# Patient Record
Sex: Female | Born: 1975 | Hispanic: Yes | Marital: Single | Smoking: Never smoker
Health system: Southern US, Community
[De-identification: ages and names within clinical notes are randomized; demographics above are authoritative.]

## PROBLEM LIST (undated history)

## (undated) DIAGNOSIS — I10 Essential (primary) hypertension: Secondary | ICD-10-CM

## (undated) DIAGNOSIS — K219 Gastro-esophageal reflux disease without esophagitis: Secondary | ICD-10-CM

## (undated) HISTORY — PX: CHOLECYSTECTOMY: SHX55

---

## 2004-05-16 ENCOUNTER — Other Ambulatory Visit: Payer: Self-pay

## 2009-07-24 ENCOUNTER — Emergency Department: Payer: Self-pay | Admitting: Emergency Medicine

## 2010-02-22 ENCOUNTER — Inpatient Hospital Stay: Payer: Self-pay | Admitting: Obstetrics and Gynecology

## 2010-11-19 ENCOUNTER — Ambulatory Visit: Payer: Self-pay | Admitting: Family Medicine

## 2010-12-13 ENCOUNTER — Encounter: Payer: Self-pay | Admitting: Obstetrics and Gynecology

## 2010-12-28 ENCOUNTER — Inpatient Hospital Stay: Payer: Self-pay | Admitting: Obstetrics and Gynecology

## 2011-01-03 ENCOUNTER — Encounter: Payer: Self-pay | Admitting: Maternal and Fetal Medicine

## 2011-11-02 IMAGING — US US OB US >=[ID] SNGL FETUS
1 series · 13 of 28 positions shown · non-contrast
Comparison: none

REASON FOR EXAM: dating approximately 7 months along
COMMENTS:

[Series 1: us ob us >=(id) sngl fetus · 0.21mm/px · 13 of 61 slices shown]
[im 3/61]
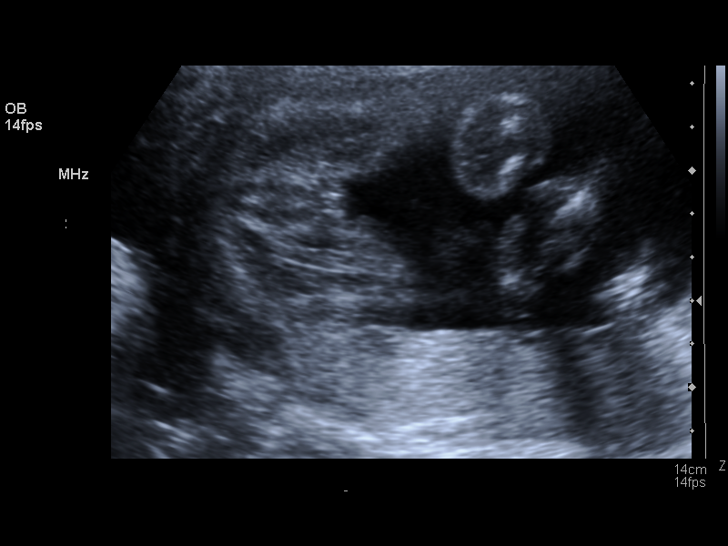
[im 7/61]
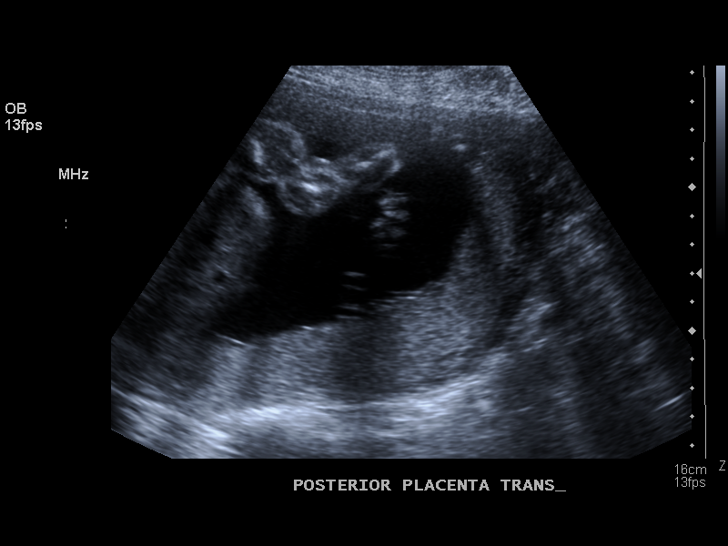
[im 12/61]
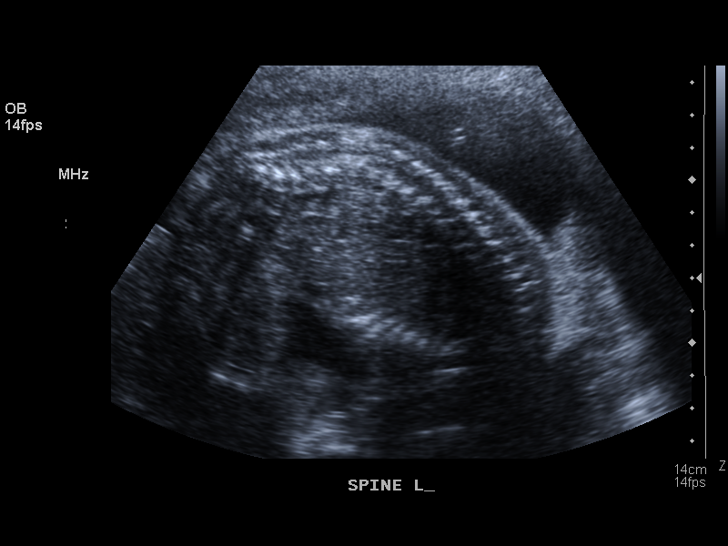
[im 16/61]
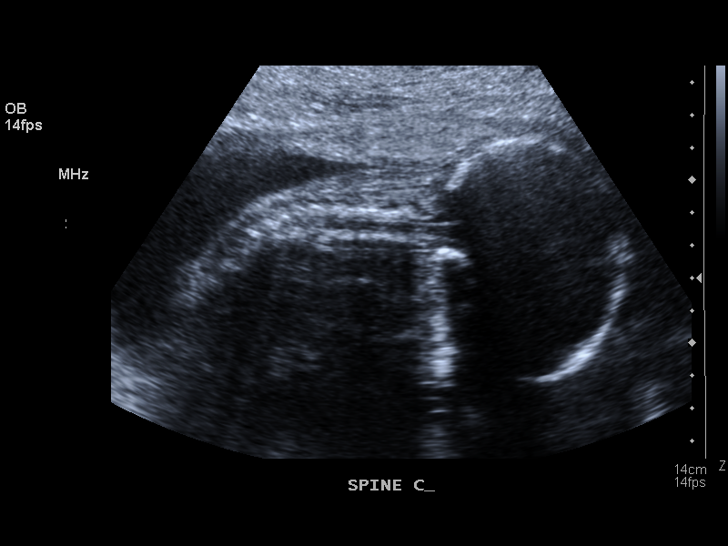
[im 21/61]
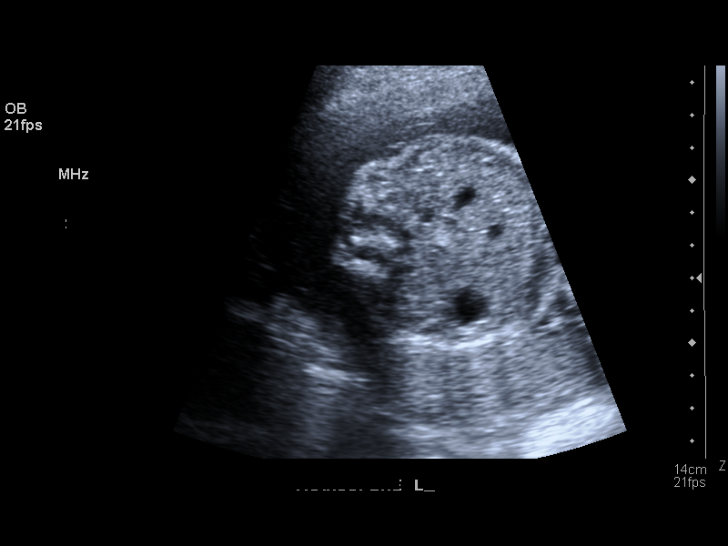
[im 25/61]
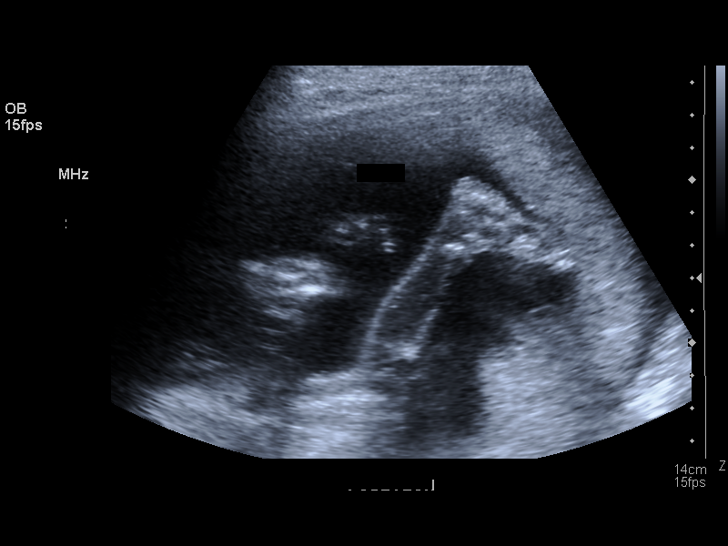
[im 32/61]
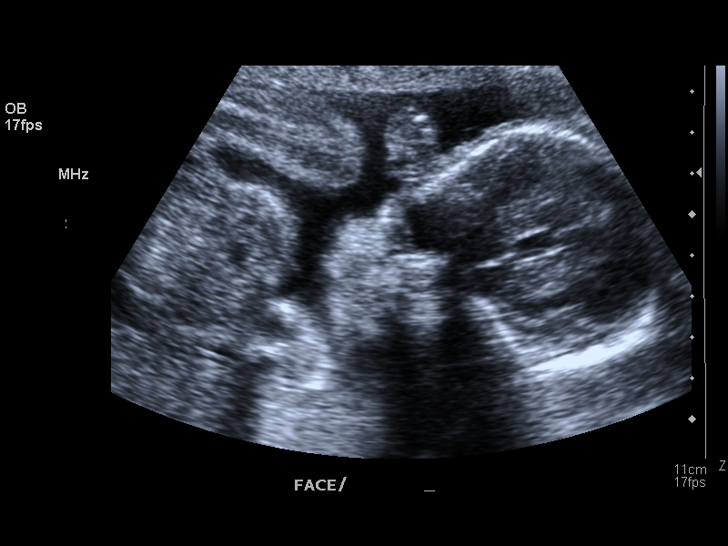
[im 36/61]
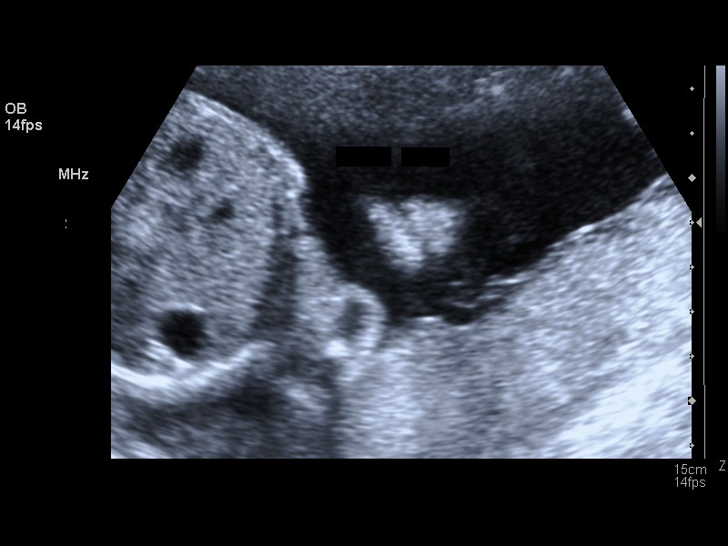
[im 41/61]
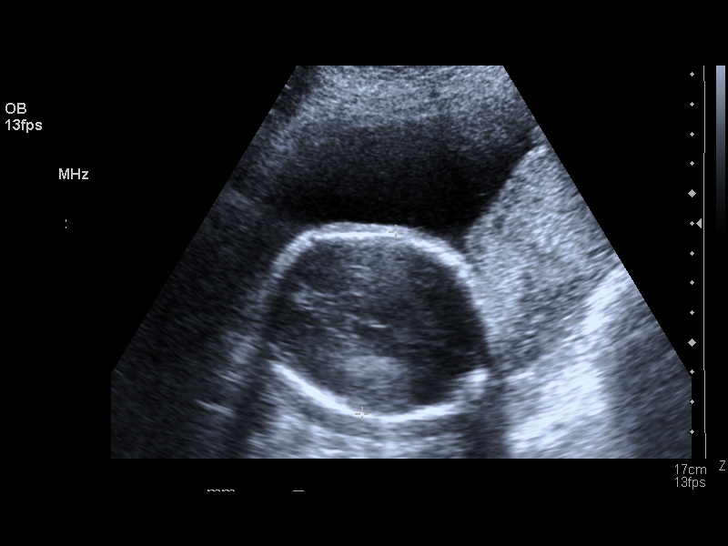
[im 45/61]
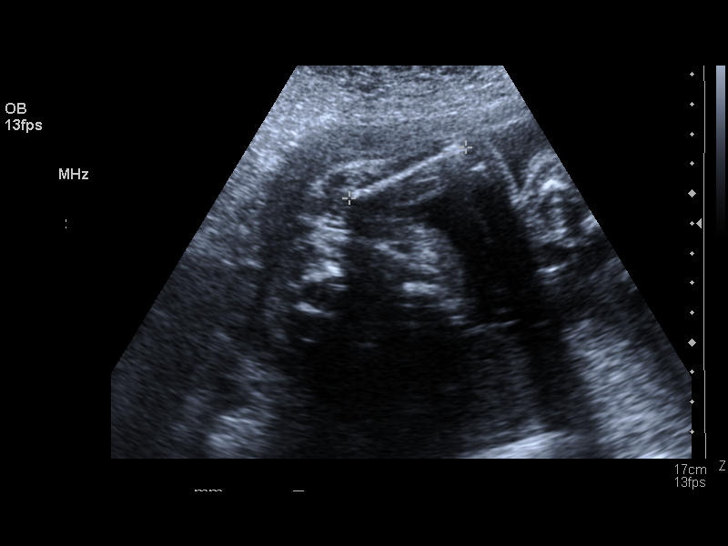
[im 49/61]
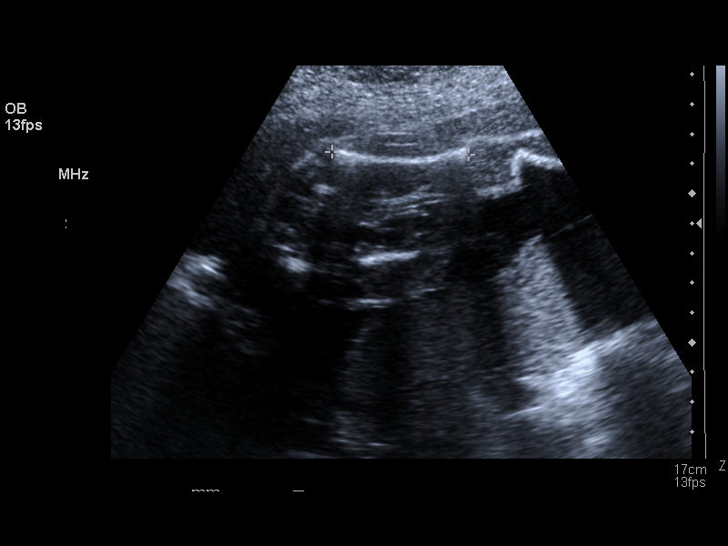
[im 54/61]
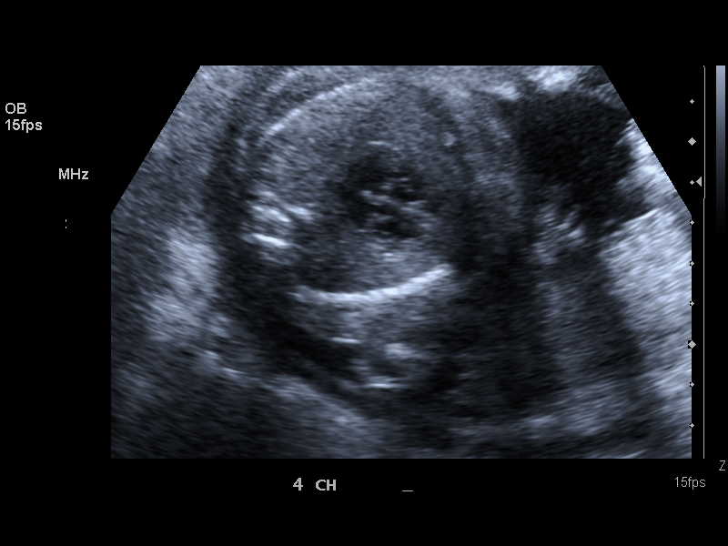
[im 58/61]
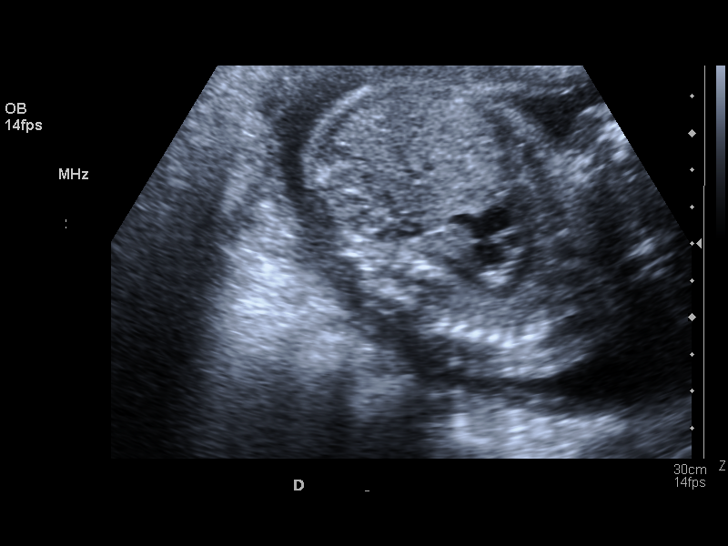

[13 of 28 positions shown; findings below may reference images not displayed]

PROCEDURE:     US  - US OB GREATER/OR EQUAL TO JVINP  - November 19, 2010  [DATE]

RESULT:     There is observed a single living intrauterine gestation.
Presentation currently is transverse. Amnionic fluid volume appears normal.
The placenta is posterior in location and extends to approximately 7.6 mm
above the cervix. The placenta therefore is low-lying. Fetal cardiac
activity was observed by the technologist but monitoring for fetal heart
rate is not recorded.

The fetal heart, stomach, and urinary bladder are visualized. No
hydrocephalus or hydronephrosis is seen. No fetal abnormalities are
detected. Fetal measurements are as follows:

BPD: 61.7 mm corresponding to 25 weeks-7 days
HC: 229.6 mm corresponding to 25 weeks-7 days
AC: 226.5 mm corresponding to 27 weeks-7 days
FL: 45.9 mm corresponding to 25 weeks-0 days
HL: 42.7 mm corresponding to 25 weeks-2 days

EFW is 890 grams + / - 132 grams. Average ultrasound age is 25 weeks-2 days.
Ultrasound EDD is 02/28/2011.
IMPRESSION: 1. There is a single living intrauterine gestation of approximately 25
weeks-2 days age by ultrasound. This is approximately 2 week discrepancy
with regard to the clinical age of 27 weeks-9 days.
2. Ultrasound EDD is 02/28/2011.
3. The placenta is extremely low-lying.
4. AFI measures 16 cm.

## 2011-11-26 IMAGING — US US OB DETAIL+14 WK - NRPT [facility]
1 series · 14 of 28 positions shown · non-contrast
Comparison: none

[Series 1: us ob detail+14 wk - nrpt [facility] · 14 of 95 slices shown]
[im 4/95]
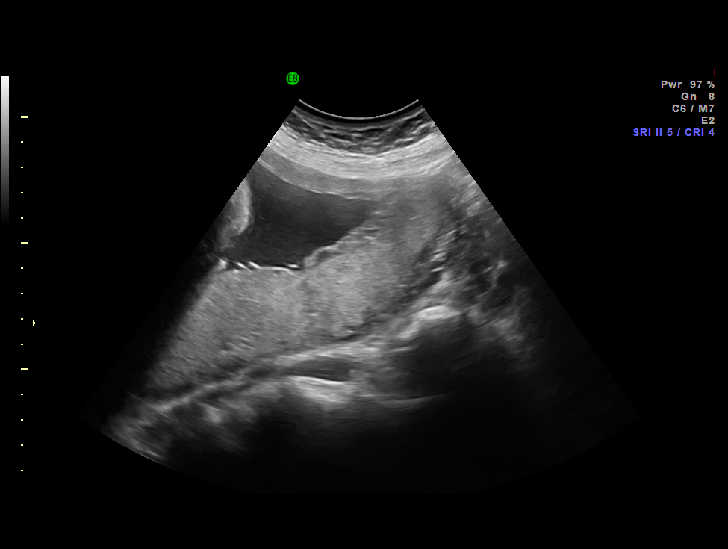
[im 11/95]
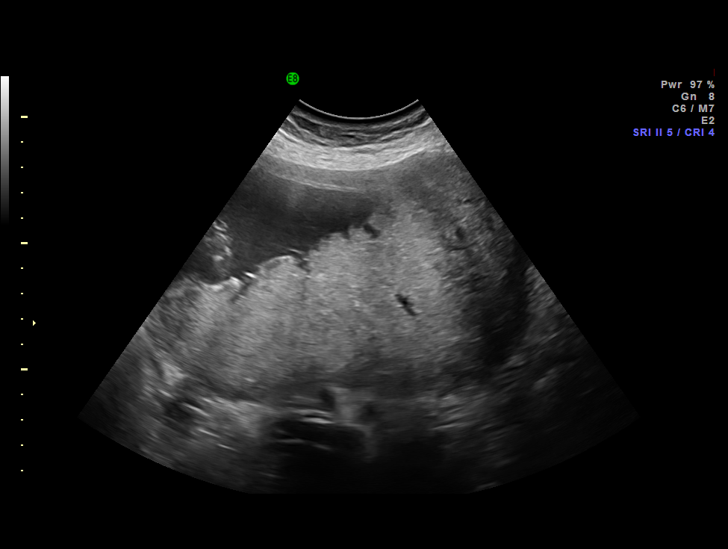
[im 18/95]
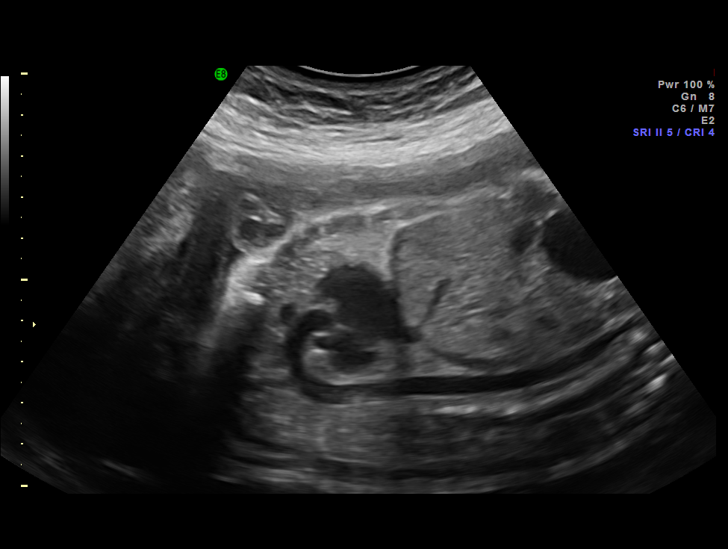
[im 25/95]
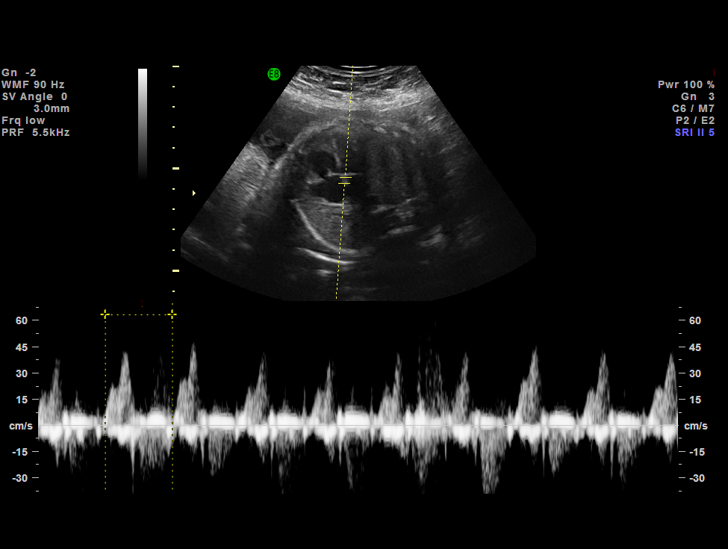
[im 32/95]
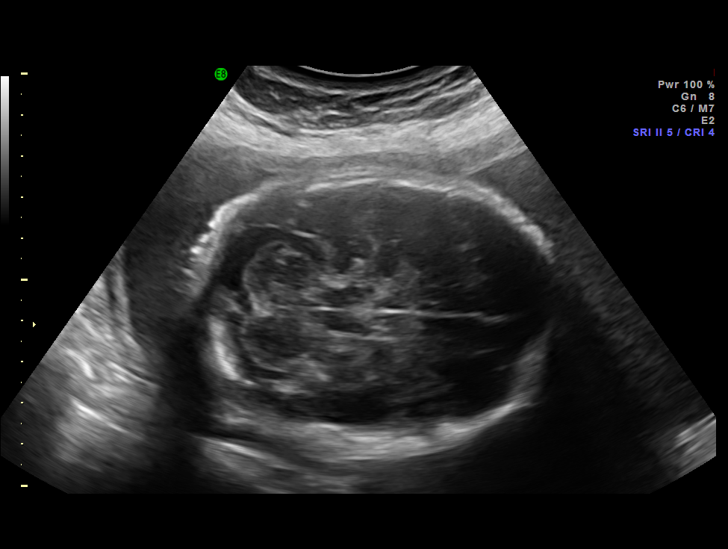
[im 39/95]
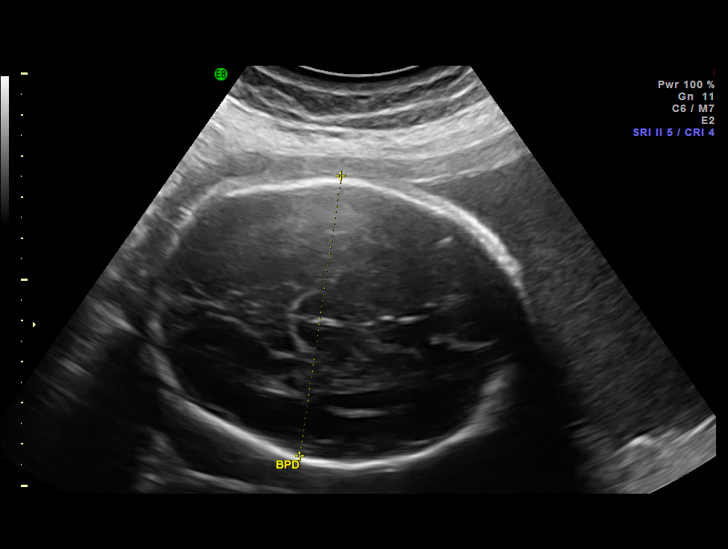
[im 46/95]
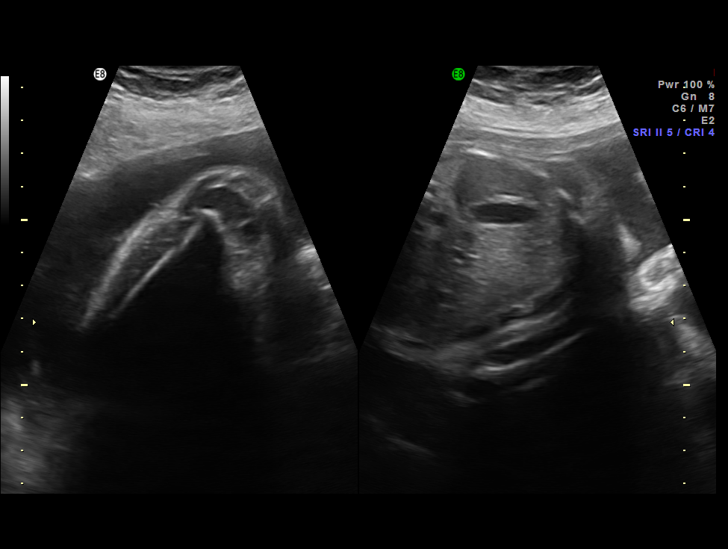
[im 53/95]
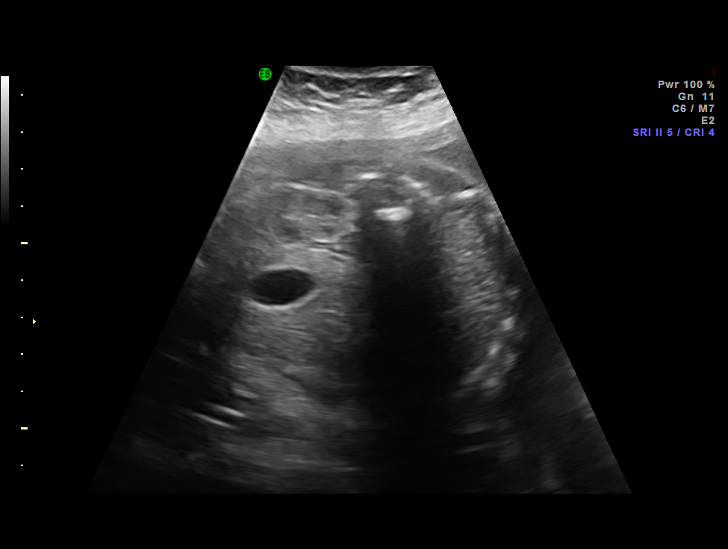
[im 60/95]
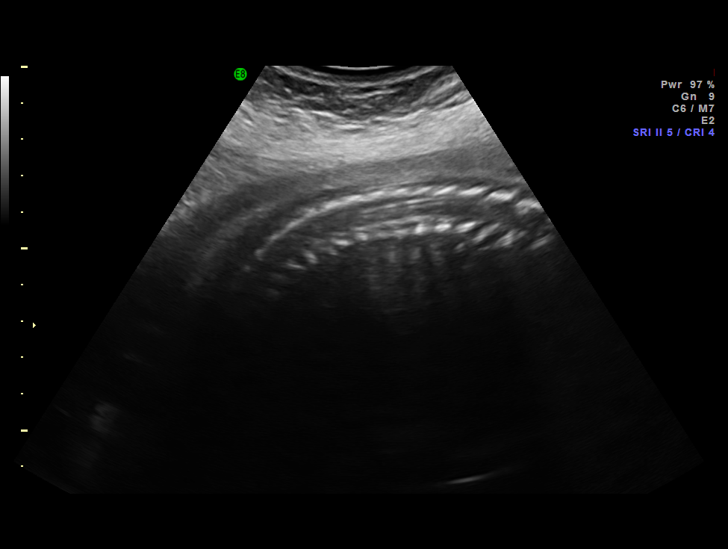
[im 67/95]
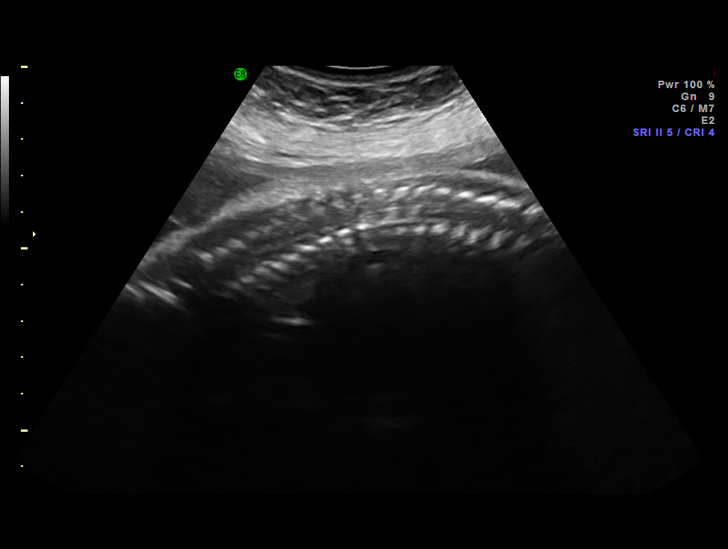
[im 74/95]
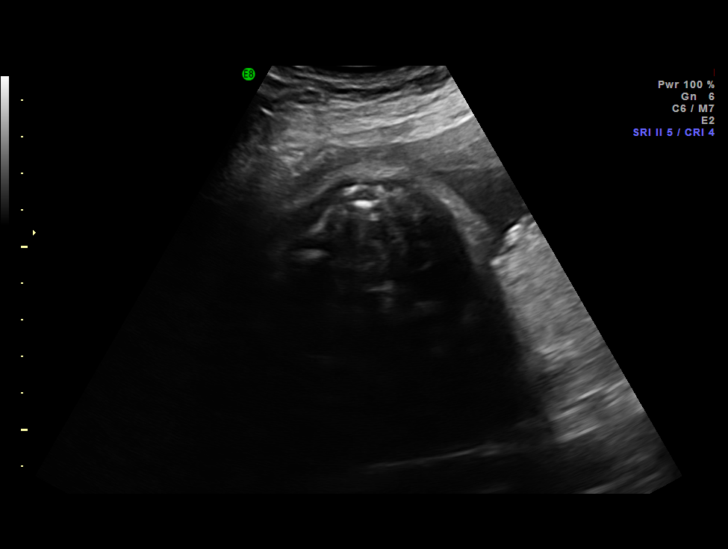
[im 81/95]
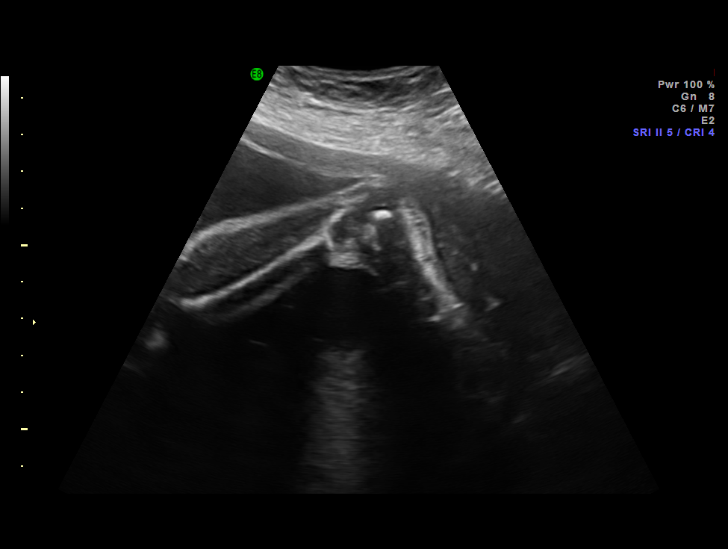
[im 88/95]
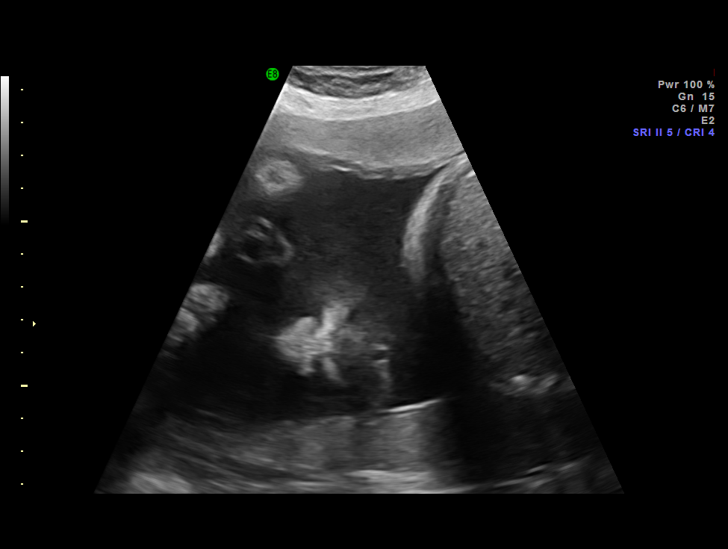
[im 95/95]
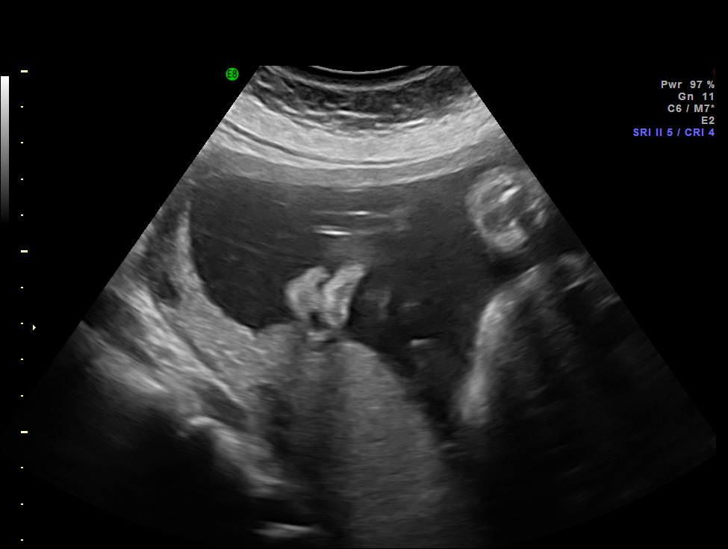

[14 of 28 positions shown; findings below may reference images not displayed]

IMAGES IMPORTED FROM THE [employer] WORKFLOW SYSTEM
NO DICTATION FOR STUDY

## 2012-01-18 ENCOUNTER — Emergency Department: Payer: Self-pay | Admitting: *Deleted

## 2012-01-18 LAB — COMPREHENSIVE METABOLIC PANEL
Bilirubin,Total: 0.7 mg/dL (ref 0.2–1.0)
Chloride: 105 mmol/L (ref 98–107)
Co2: 22 mmol/L (ref 21–32)
Creatinine: 0.63 mg/dL (ref 0.60–1.30)
EGFR (African American): >60
Potassium: 3.5 mmol/L (ref 3.5–5.1)
SGOT(AST): 51 U/L — ABNORMAL HIGH (ref 15–37)
SGPT (ALT): 67 U/L
Sodium: 140 mmol/L (ref 136–145)
Total Protein: 8.9 g/dL — ABNORMAL HIGH (ref 6.4–8.2)

## 2012-01-18 LAB — CBC
HCT: 39.8 % (ref 35.0–47.0)
MCH: 29.5 pg (ref 26.0–34.0)
MCHC: 34.1 g/dL (ref 32.0–36.0)
MCV: 87 fL (ref 80–100)
Platelet: 273 10*3/uL (ref 150–440)
RBC: 4.6 10*6/uL (ref 3.80–5.20)
RDW: 13 % (ref 11.5–14.5)
WBC: 7 10*3/uL (ref 3.6–11.0)

## 2012-01-18 LAB — URINALYSIS, COMPLETE
Glucose,UR: NEGATIVE mg/dL (ref 0–75)
Granular Cast: 16
Ketone: NEGATIVE
Leukocyte Esterase: NEGATIVE
Nitrite: NEGATIVE
WBC UR: 4 /HPF (ref 0–5)

## 2012-11-06 ENCOUNTER — Ambulatory Visit: Payer: Self-pay | Admitting: Family Medicine

## 2013-02-11 ENCOUNTER — Ambulatory Visit: Payer: Self-pay | Admitting: Obstetrics and Gynecology

## 2013-02-11 LAB — CBC WITH DIFFERENTIAL/PLATELET
Basophil #: 0 10*3/uL (ref 0.0–0.1)
HGB: 10.8 g/dL — ABNORMAL LOW (ref 12.0–16.0)
Lymphocyte %: 28.4 %
MCH: 28.3 pg (ref 26.0–34.0)
Monocyte #: 0.6 x10 3/mm (ref 0.2–0.9)
Monocyte %: 7.9 %
Platelet: 237 10*3/uL (ref 150–440)
RBC: 3.83 10*6/uL (ref 3.80–5.20)
RDW: 14.8 % — ABNORMAL HIGH (ref 11.5–14.5)
WBC: 7.2 10*3/uL (ref 3.6–11.0)

## 2013-02-12 ENCOUNTER — Inpatient Hospital Stay: Payer: Self-pay | Admitting: Obstetrics and Gynecology

## 2013-02-12 LAB — PIH PROFILE
Anion Gap: 4 — ABNORMAL LOW (ref 7–16)
BUN: 8 mg/dL (ref 7–18)
Chloride: 105 mmol/L (ref 98–107)
Co2: 29 mmol/L (ref 21–32)
Creatinine: 0.49 mg/dL — ABNORMAL LOW (ref 0.60–1.30)
EGFR (Non-African Amer.): >60
HGB: 10.7 g/dL — ABNORMAL LOW (ref 12.0–16.0)
MCH: 29.1 pg (ref 26.0–34.0)
MCV: 86 fL (ref 80–100)
Osmolality: 273 (ref 275–301)
Potassium: 3.9 mmol/L (ref 3.5–5.1)
RBC: 3.68 10*6/uL — ABNORMAL LOW (ref 3.80–5.20)
RDW: 14.8 % — ABNORMAL HIGH (ref 11.5–14.5)
SGOT(AST): 26 U/L (ref 15–37)
Sodium: 138 mmol/L (ref 136–145)

## 2013-02-13 LAB — HEMATOCRIT: HCT: 26.4 % — ABNORMAL LOW (ref 35.0–47.0)

## 2013-10-20 IMAGING — US US OB US >=[ID] SNGL FETUS
1 series · 14 of 28 positions shown · non-contrast
Comparison: none

REASON FOR EXAM: dating anatomy placenta location
COMMENTS:

[Series 1: us ob us >=(id) sngl fetus · 0.32mm/px · 14 of 93 slices shown]
[im 4/93]
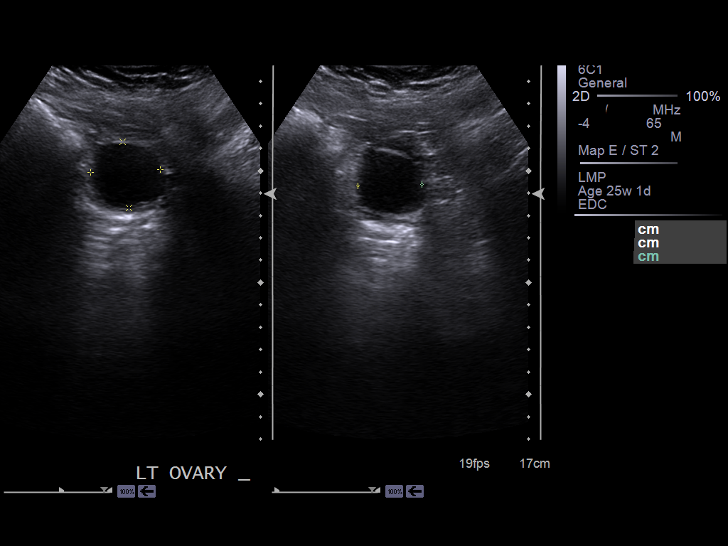
[im 11/93]
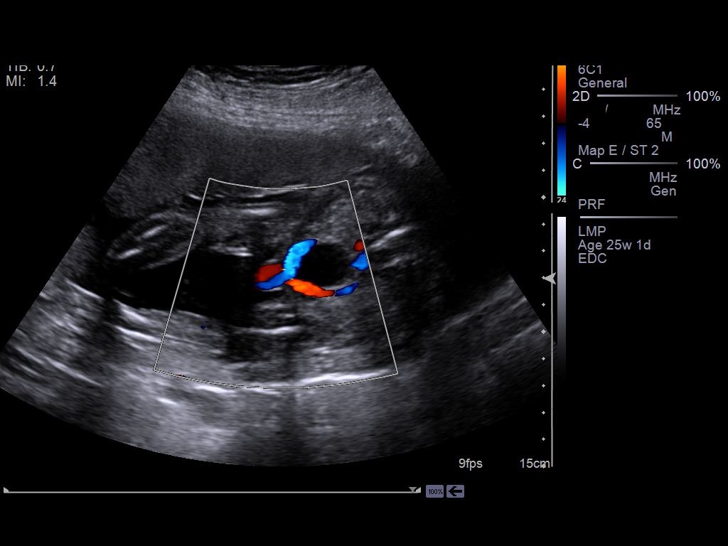
[im 18/93]
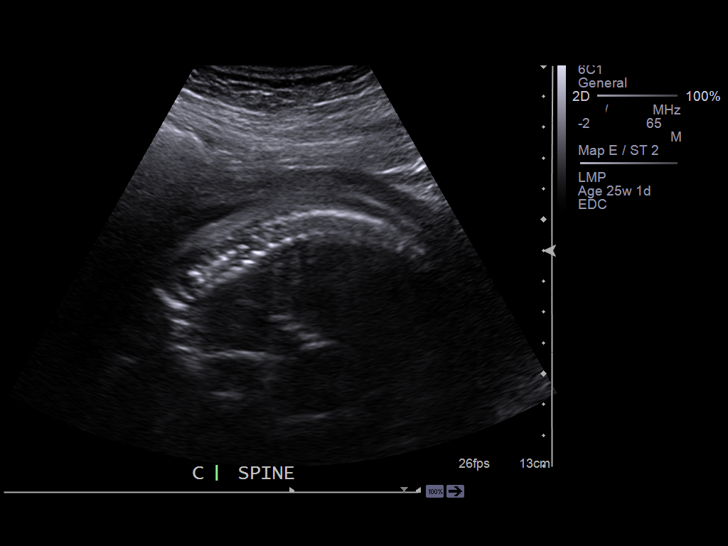
[im 24/93]
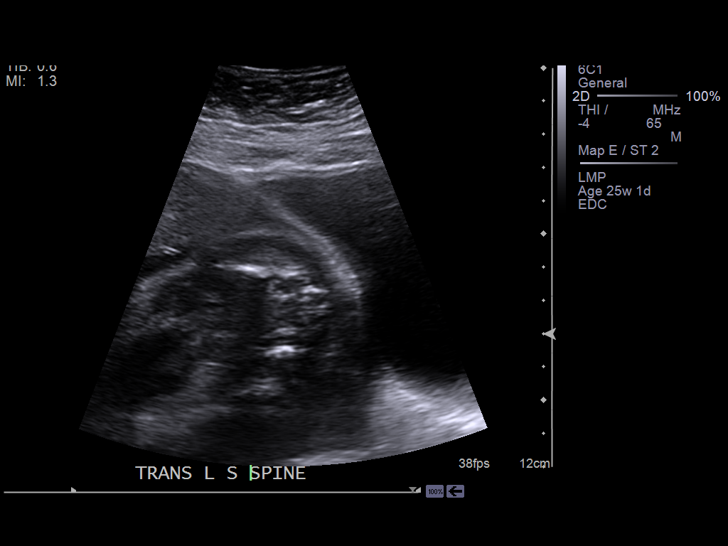
[im 31/93]
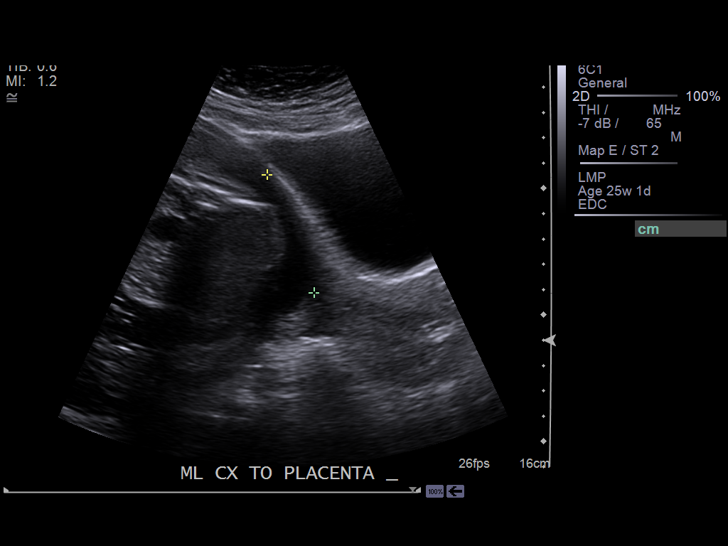
[im 38/93]
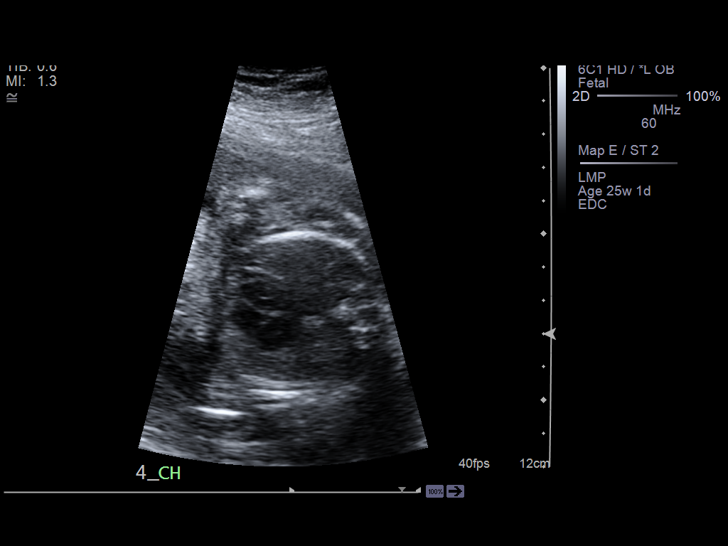
[im 45/93]
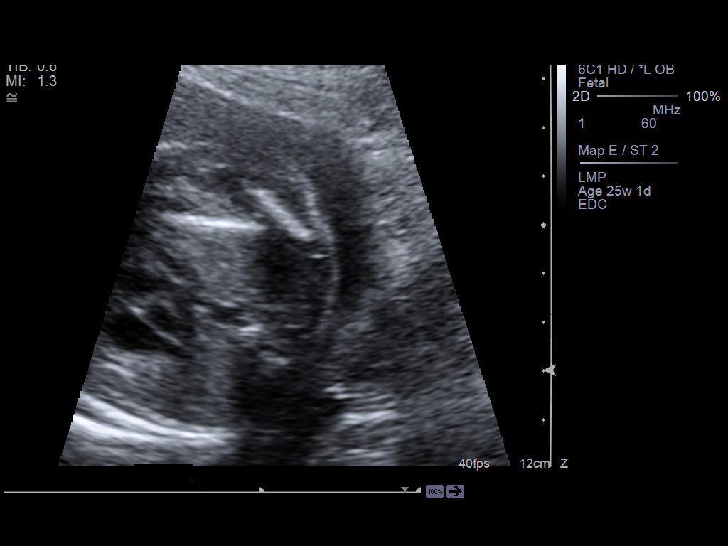
[im 52/93]
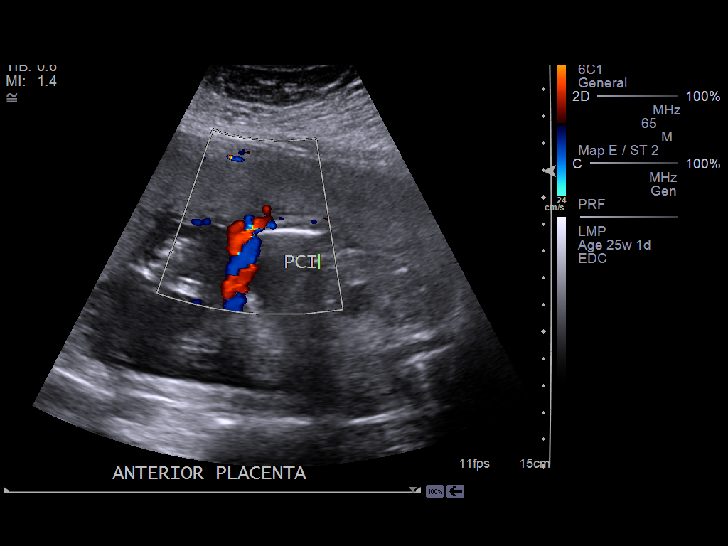
[im 58/93]
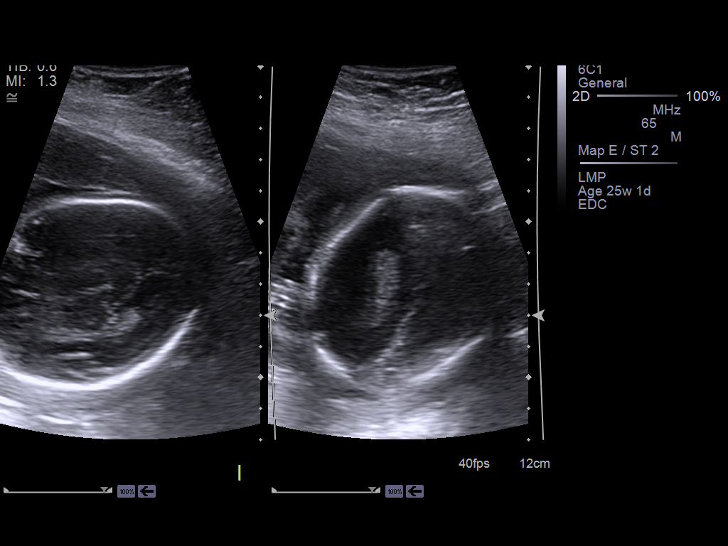
[im 65/93]
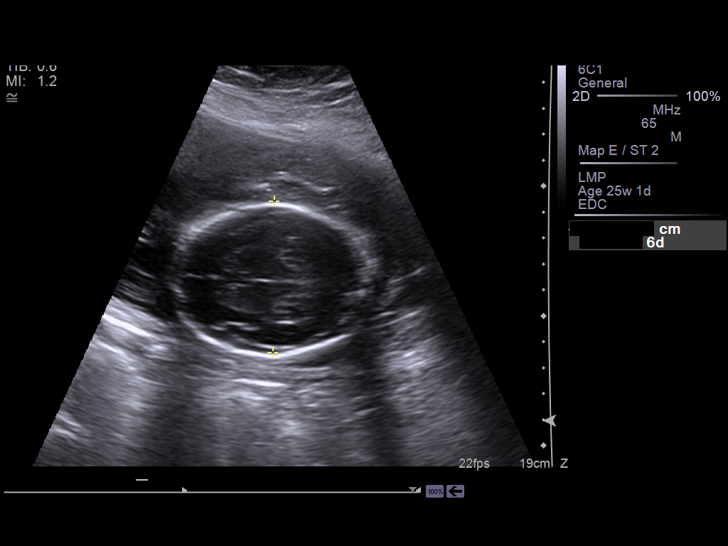
[im 72/93]
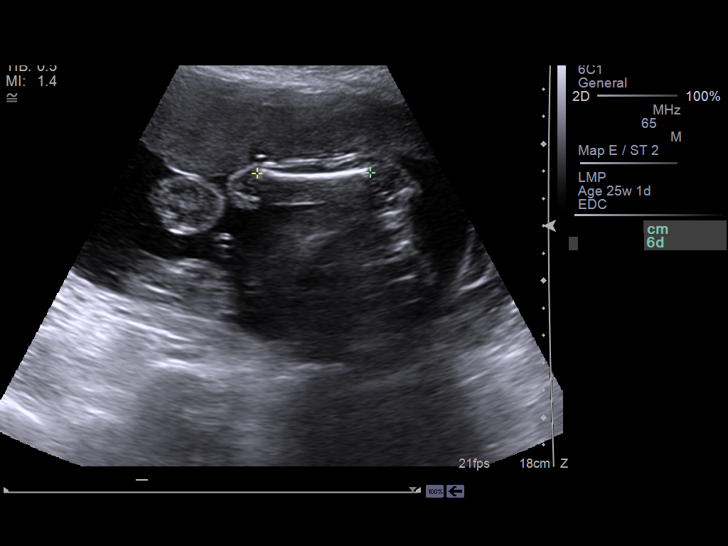
[im 79/93]
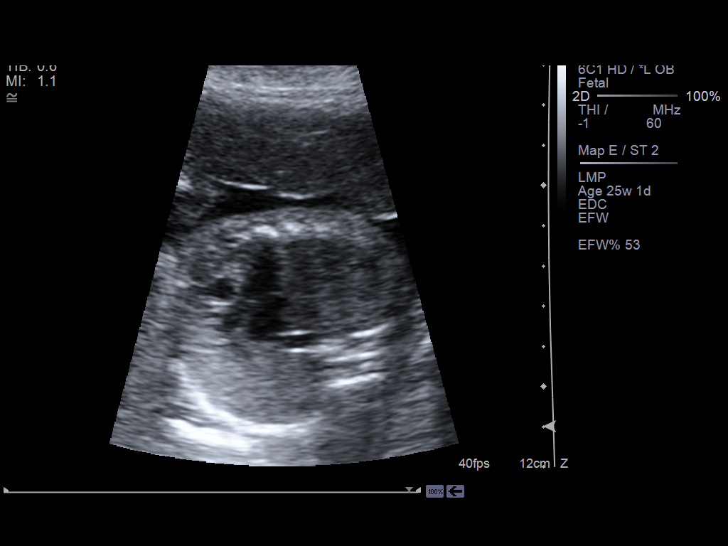
[im 86/93]
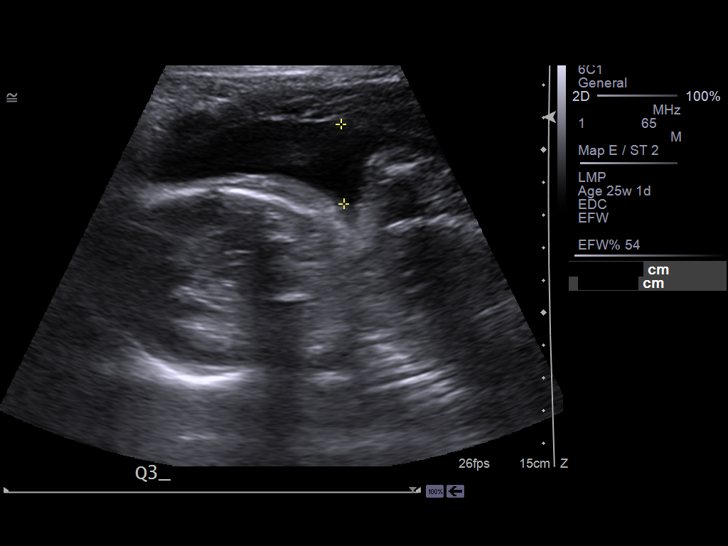
[im 93/93]
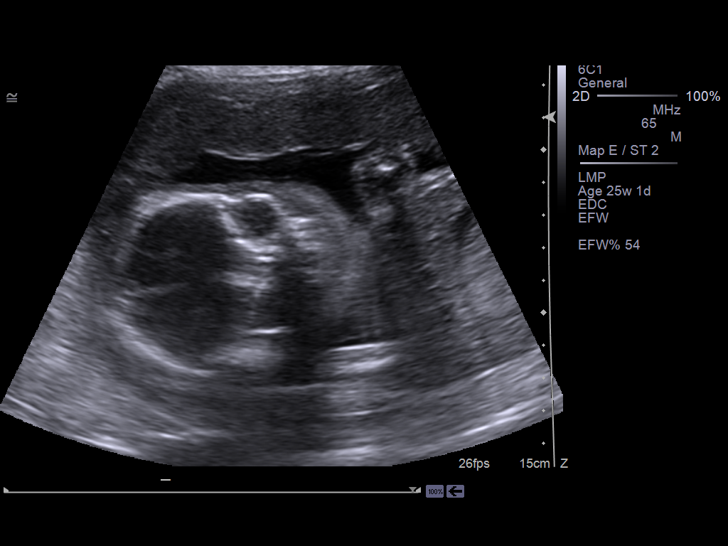

[14 of 28 positions shown; findings below may reference images not displayed]

PROCEDURE:     US  - US OB GREATER/OR EQUAL TO BXISN  - November 06, 2012  [DATE]

RESULT:     Single viable intrauterine pregnancy is present with fetal heart
rate of 140 beats per minute. Fetal presentation is breech. Active trunk and
extremity motion noted. Amniotic fluid volume is normal. Placenta is
anterior and 5.1 cm from the cervical os. Fetal head, spine, chest, abdomen,
and pelvis unremarkable. Ultrasound at a tertiary care center or high risk
center should be considered if any complicated features to the patient's
pregnancy.

Abdominal circumference 21.1 cm, femur  length 4.7 cm, humeral length
cm. BPD 5.9 cm. These measurements correlate well and correspond to an
estimated gestational age by ultrasound of 25 weeks 0 days.
IMPRESSION: Single viable intrauterine pregnancy at 25 weeks 0 days.
Presentation is breech.

## 2015-02-10 NOTE — Discharge Summary (Signed)
Dates of Admission and Diagnosis:  Date of Admission 12-Feb-2013   Date of Discharge 15-Feb-2013   Admitting Diagnosis for c/s and BTL   Final Diagnosis c/s, BTL    Chief Complaint/History of Present Illness [facility]   [facility] Course:  Hospital Course Did well BP required increase labetalol to 200mg  BID with acceptable control   Condition on Discharge Good   DISCHARGE INSTRUCTIONS HOME MEDS:  Medication Reconciliation: Patient's Home Medications at Discharge:     Medication Instructions  acetaminophen-hydrocodone 300 mg-7.5 mg oral tablet  1 tab(s) orally every 6 hours   ibuprofen 800 mg oral tablet  1 tab(s) orally 3 times a day   prenatal multivitamins with folic acid 1 mg oral tablet  1 tab(s) orally once a day    PRESCRIPTIONS: PRINTED AND PLACED ON CHART   Physician's Instructions:  Home Health? No   Treatments None   Dressing Care no dressing.  Keep clean and dry   Diet Regular   Dietary Supplements None   Activity Limitations No heavy lifting   Return to Work after follow up visit with MD   Time frame for Follow Up Appointment 2-4 weeks   Electronic Signatures: Margaretha GlassingEvans, Ricky L (MD)  (Signed 29-Apr-14 09:21)  Authored: ADMISSION DATE AND DIAGNOSIS, CHIEF COMPLAINT/HPI, HOSPITAL COURSE, DISCHARGE INSTRUCTIONS HOME MEDS, PATIENT INSTRUCTIONS   Last Updated: 29-Apr-14 09:21 by Margaretha GlassingEvans, Ricky L (MD)

## 2015-02-10 NOTE — Op Note (Signed)
PATIENT NAME:  Jessica Bright, Jessica Bright MR#:  [id number] DATE OF BIRTH:  1976/01/20  DATE OF PROCEDURE:  02/12/2013  PREOPERATIVE DIAGNOSES:  1.  Elective permanent sterilization.  2.  Elective repeat cesarean section. 3.  39 plus [redacted] weeks gestation.   POSTOPERATIVE DIAGNOSES:  1.  Elective permanent sterilization.  2.  Elective repeat cesarean section. 3.  39 plus [redacted] weeks gestation.  4.  A 3 x 3 cm left paratubal cyst.   PROCEDURES:  1.  Elective repeat low transverse cesarean section.  2.  Bilateral tubal ligation, [place]. 3.  Left paratubal cystectomy.  4.  On-Q pump placement.   SURGEON: Jennell Corner, MD  FIRST ASSISTANT: Doman, scrub tech  ANESTHESIA: Spinal.   INDICATIONS: This is a 39 year old gravida 9 para 7, a patient with a previous cesarean section and elects for repeat cesarean section and permanent sterilization. The patient is aware of the failure rate for tubal ligation of 1:200.   DESCRIPTION OF PROCEDURE: After adequate spinal anesthesia, the patient was placed in the dorsal supine position with a hip roll under the right side. The patient's abdomen was prepped and draped in normal sterile fashion. The patient did receive 2 grams IV Ancef prior to commencement of the case. A Pfannenstiel incision was made 2 fingerbreadths above the symphysis pubis. Sharp dissection was used to identify the fascia. The fascia was opened in the midline and opened in a transverse fashion. The superior aspect of the fascia was grasped with Kocher clamps and the recti muscles dissected free. Entry into the peritoneal cavity was accomplished sharply. There were several omental adhesions that were attached to the anterior abdominal wall and to the uterus. These were taken down with the Bovie. Ultimately, the lower uterine segment was identified and vesicouterine peritoneal fold was incised and a flap was created and the bladder was reflected inferiorly. A low transverse uterine incision was made.  Upon entry into the endometrial cavity, clear fluid resulted. Incision was extended with blunt transverse traction. Fetal head brought to the incision and the vacuum was applied to the fetal occiput. One gentle pull allowed for delivery of the head. The vacuum was taken down and the shoulders and body delivered without difficulty. Vigorous female was passed to Dr. Abundio Miu, neonatologist, who assigned Apgar scores of 9 and 9.  40 units of Pitocin was added to the IV bag while the placenta was manually delivered. The uterus was exteriorized. The endometrial cavity was wiped clean with laparotomy tape and the ring forceps was used to open the cervix and this was passed off the operative field. The uterine incision was then closed with 1 chromic suture in a running locking fashion with good approximation of edges, good hemostasis noted. Of note, there was a 3 x 3 complex left paratubal cyst that was identified. First the tubal ligation was performed. The left fallopian tube was grasped at the midportion of the fallopian tube and 2 separate 0 plain gut sutures were applied to the fallopian tube and a 1.5 cm portion of the fallopian tube was removed. Good hemostasis was noted. Kelly clamp was then placed over the distal portion of the left fimbria incorporating the left paratubal cyst. This was sharply excised and pedicle was then ligated with 0 Vicryl suture. Good hemostasis was noted. A similar procedure was repeated on the patient's right fallopian tube.  After grasping in the midportion of the fallopian tube, 2 separate 0 plain gut sutures were applied and a 1.5 cm portion of fallopian tube  was removed. Good hemostasis was noted. The posterior cul-de-sac was irrigated and suctioned and the uterus was placed back into the abdominal cavity. The paracolic gutters were wiped clean with laparotomy tape and the left tubal site and paratubal cystectomy site appeared hemostatic. The right paracolic gutter was wiped clean with  laparotomy tape and the tubal ligation site appeared hemostatic. The uterine incision appeared hemostatic. Interceed was placed over the uterine incision in a T-shaped fashion. The superior aspect of the fascia was then grasped with Kocher clamps and the On-Q pump catheters were placed from an infraumbilical incision to a subfascial position. The fascia was then closed over top the catheters with 0 Vicryl suture in a running nonlocking fashion with good approximation of edges noted. Subcutaneous tissues were irrigated and bovied and, given the depth of the subcutaneous tissues, the subcutaneous tissues were closed with interrupted 2-0 chromic suture and the skin was reapproximated with staples. Dermabond used to secure the On-Q pumps at the skin level and Steri-Strips were applied over the catheters and Tegaderm applied on top of this. Each catheter       was loaded with 5 mL of 0.5% Marcaine. There were no complications. Estimated blood 750 mL. Inter-op fluids 900 mL.  The patient was taken to the recovery room in good condition. ____________________________ Suzy Bouchardhomas J. Tanishi Nault, MD tjs:sb D: 02/12/2013 09:23:41 ET T: 02/12/2013 10:15:19 ET JOB#: [id number]  cc: Suzy Bouchardhomas J. Fransisca Shawn, MD, <Dictator> Suzy BouchardHOMAS J Samone Guhl MD ELECTRONICALLY SIGNED 02/16/2013 12:07

## 2016-10-30 ENCOUNTER — Encounter: Payer: Self-pay | Admitting: *Deleted

## 2016-10-30 ENCOUNTER — Emergency Department
Admission: EM | Admit: 2016-10-30 | Discharge: 2016-10-30 | Disposition: A | Discharge status: Home / Self Care | Payer: Self-pay | Attending: Emergency Medicine | Admitting: Emergency Medicine

## 2016-10-30 DIAGNOSIS — N390 Urinary tract infection, site not specified: Secondary | ICD-10-CM | POA: Insufficient documentation

## 2016-10-30 LAB — URINALYSIS, COMPLETE (UACMP) WITH MICROSCOPIC
BILIRUBIN URINE: NEGATIVE
GLUCOSE, UA: NEGATIVE mg/dL
KETONES UR: NEGATIVE mg/dL
Nitrite: NEGATIVE
PH: 6 (ref 5.0–8.0)
Protein, ur: 30 mg/dL — AB
SQUAMOUS EPITHELIAL / LPF: NONE SEEN
Specific Gravity, Urine: 1.013 (ref 1.005–1.030)

## 2016-10-30 LAB — POCT PREGNANCY, URINE: Preg Test, Ur: NEGATIVE

## 2016-10-30 MED ORDER — SULFAMETHOXAZOLE-TRIMETHOPRIM 800-160 MG PO TABS: 1.0000 | ORAL_TABLET | Freq: Two times a day (BID) | ORAL | 0 refills | Status: DC

## 2016-10-30 MED ORDER — PHENAZOPYRIDINE HCL 100 MG PO TABS: 100.0000 mg | ORAL_TABLET | Freq: Three times a day (TID) | ORAL | 0 refills | Status: AC | PRN

## 2016-10-30 NOTE — ED Notes (Signed)
With interpreter present, pt. Verbalizes understanding of d/c instructions, prescriptions, and follow-up. VS stable and pain controlled per pt.  Pt. In NAD at time of d/c and denies further concerns regarding this visit. Pt. Stable at the time of departure from the unit, departing unit by the safest and most appropriate manner per that pt condition and limitations. Pt advised to return to the ED at any time for emergent concerns, or for new/worsening symptoms.

## 2016-10-30 NOTE — ED Provider Notes (Signed)
[facility] Emergency Department Provider Note   ____________________________________________   First MD Initiated Contact with Patient 10/30/16 1520     (approximate)  I have reviewed the triage vital signs and the nursing notes.   HISTORY  Chief Complaint Dysuria Per interpreter   HPI Jessica Bright is a 41 y.o. female is here today with complaint of dysuria. Patient states that she began having some symptoms approximately 3 weeks ago which is gradually gotten worse. Today she complains of frequency and burning. Patient is not aware of any hematuria. She denies any past history of kidney stones. Currently she denies any fever, chills, nausea or vomiting. Patient has not taken any over-the-counter medication prior to arrival. Patient has been drinking lots of fluids to prevent a urinary tract infection. She denies any vaginal discharge. She denies any pelvic pain.   History reviewed. No pertinent past medical history.  There are no active problems to display for this patient.   History reviewed. No pertinent surgical history.  Prior to Admission medications   Medication Sig Start Date End Date Taking? Authorizing Provider  phenazopyridine (PYRIDIUM) 100 MG tablet Take 1 tablet (100 mg total) by mouth 3 (three) times daily as needed for pain. 10/30/16 10/30/17  Tommi Rumps, PA-C  sulfamethoxazole-trimethoprim (BACTRIM DS,SEPTRA DS) 800-160 MG tablet Take 1 tablet by mouth 2 (two) times daily. 10/30/16   Tommi Rumps, PA-C    Allergies Patient has no known allergies.  History reviewed. No pertinent family history.  Social History Social History  Substance Use Topics  . Smoking status: Never Smoker  . Smokeless tobacco: Never Used  . Alcohol use No    Review of Systems Constitutional: No fever/chills Cardiovascular: Denies chest pain. Respiratory: Denies shortness of breath. Gastrointestinal: No abdominal pain.  No nausea, no vomiting.   Genitourinary: Positive for dysuria Musculoskeletal: Negative for back pain. Skin: Negative for rash. Neurological: Positive for mild headaches, no focal weakness or numbness.  10-point ROS otherwise negative.  ____________________________________________   PHYSICAL EXAM:  VITAL SIGNS: ED Triage Vitals  Enc Vitals Group     BP 10/30/16 1349 (!) 142/79     Pulse Rate 10/30/16 1349 96     Resp 10/30/16 1349 18     Temp 10/30/16 1349 99.6 F (37.6 C)     Temp Source 10/30/16 1349 Oral     SpO2 10/30/16 1349 97 %     Weight 10/30/16 1350 150 lb (68 kg)     Height --      Head Circumference --      Peak Flow --      Pain Score 10/30/16 1350 9     Pain Loc --      Pain Edu? --      Excl. in GC? --     Constitutional: Alert and oriented. Well appearing and in no acute distress. Eyes: Conjunctivae are normal. PERRL. EOMI. Head: Atraumatic. Nose: No congestion/rhinnorhea. Neck: No stridor.   Cardiovascular: Normal rate, regular rhythm. Grossly normal heart sounds.  Good peripheral circulation. Respiratory: Normal respiratory effort.  No retractions. Lungs CTAB. Gastrointestinal: Soft and nontender. No distention. No CVA tenderness. Musculoskeletal: Moves upper and lower extremities without difficulty. Normal gait was noted. Neurologic:  Normal speech and language. No gross focal neurologic deficits are appreciated. No gait instability. Skin:  Skin is warm, dry and intact. No rash noted. Psychiatric: Mood and affect are normal. Speech and behavior are normal.  ____________________________________________   LABS (all labs ordered  are listed, but only abnormal results are displayed)  Labs Reviewed  URINALYSIS, COMPLETE (UACMP) WITH MICROSCOPIC - Abnormal; Notable for the following:       Result Value   Color, Urine YELLOW (*)    APPearance HAZY (*)    Hgb urine dipstick LARGE (*)    Protein, ur 30 (*)    Leukocytes, UA LARGE (*)    Bacteria, UA MANY (*)    All other  components within normal limits  POC URINE PREG, ED  POCT PREGNANCY, URINE    PROCEDURES  Procedure(s) performed: None  Procedures  Critical Care performed: No  ____________________________________________   INITIAL IMPRESSION / ASSESSMENT AND PLAN / ED COURSE  Pertinent labs & imaging results that were available during my care of the patient were reviewed by me and considered in my medical decision making (see chart for details).    Clinical Course    Patient was made aware through the interpreter that she did have a urinary tract infection. Patient was placed on Bactrim DS twice a day for 10 days. It was stressed to her that she is to continue the entire 10 days even if she starts feeling better. She is to increase fluids. Also take Tylenol if needed. Pyridium  100 mg 3 times a day as needed for dysuria. She was made aware that this would discolor her urine so that she would not become alarmed.  ____________________________________________   FINAL CLINICAL IMPRESSION(S) / ED DIAGNOSES  Final diagnoses:  Acute urinary tract infection      NEW MEDICATIONS STARTED DURING THIS VISIT:  Discharge Medication List as of 10/30/2016  5:00 PM    START taking these medications   Details  phenazopyridine (PYRIDIUM) 100 MG tablet Take 1 tablet (100 mg total) by mouth 3 (three) times daily as needed for pain., Starting Wed 10/30/2016, Until Thu 10/30/2017, Print    sulfamethoxazole-trimethoprim (BACTRIM DS,SEPTRA DS) 800-160 MG tablet Take 1 tablet by mouth 2 (two) times daily., Starting Wed 10/30/2016, Print         Note:  This document was prepared using Dragon voice recognition software and may include unintentional dictation errors.    Tommi Rumpshonda L Avalynne Diver, PA-C 10/30/16 1808    Jeanmarie PlantJames A McShane, MD 10/30/16 [phone]

## 2016-10-30 NOTE — ED Triage Notes (Signed)
Pt to ed with c/o burning with urination x 3 weeks.

## 2020-12-21 ENCOUNTER — Observation Stay: Payer: Medicaid Other | Admitting: Anesthesiology

## 2020-12-21 ENCOUNTER — Emergency Department: Payer: Medicaid Other

## 2020-12-21 ENCOUNTER — Encounter
Admission: EM | Disposition: A | Discharge status: Home / Self Care | Payer: Self-pay | Source: Home / Self Care | Attending: Emergency Medicine

## 2020-12-21 ENCOUNTER — Observation Stay
Admission: EM | Admit: 2020-12-21 | Discharge: 2020-12-22 | Disposition: A | Discharge status: Home / Self Care | Payer: Medicaid Other | Attending: General Surgery | Admitting: General Surgery

## 2020-12-21 ENCOUNTER — Other Ambulatory Visit: Payer: Self-pay

## 2020-12-21 ENCOUNTER — Encounter: Payer: Self-pay | Admitting: Emergency Medicine

## 2020-12-21 DIAGNOSIS — Z20822 Contact with and (suspected) exposure to covid-19: Secondary | ICD-10-CM | POA: Insufficient documentation

## 2020-12-21 DIAGNOSIS — I10 Essential (primary) hypertension: Secondary | ICD-10-CM | POA: Insufficient documentation

## 2020-12-21 DIAGNOSIS — K219 Gastro-esophageal reflux disease without esophagitis: Secondary | ICD-10-CM | POA: Insufficient documentation

## 2020-12-21 DIAGNOSIS — R1011 Right upper quadrant pain: Secondary | ICD-10-CM

## 2020-12-21 DIAGNOSIS — K810 Acute cholecystitis: Secondary | ICD-10-CM | POA: Diagnosis present

## 2020-12-21 DIAGNOSIS — Z79899 Other long term (current) drug therapy: Secondary | ICD-10-CM | POA: Diagnosis not present

## 2020-12-21 DIAGNOSIS — K8001 Calculus of gallbladder with acute cholecystitis with obstruction: Secondary | ICD-10-CM | POA: Diagnosis not present

## 2020-12-21 HISTORY — DX: Essential (primary) hypertension: I10

## 2020-12-21 HISTORY — DX: Gastro-esophageal reflux disease without esophagitis: K21.9

## 2020-12-21 LAB — URINALYSIS, COMPLETE (UACMP) WITH MICROSCOPIC
Bilirubin Urine: NEGATIVE
Glucose, UA: NEGATIVE mg/dL
Hgb urine dipstick: NEGATIVE
Ketones, ur: NEGATIVE mg/dL
Leukocytes,Ua: NEGATIVE
Nitrite: NEGATIVE
Protein, ur: NEGATIVE mg/dL
Specific Gravity, Urine: 1.014 (ref 1.005–1.030)
pH: 9 — ABNORMAL HIGH (ref 5.0–8.0)

## 2020-12-21 LAB — COMPREHENSIVE METABOLIC PANEL
ALT: 23 U/L (ref 0–44)
AST: 21 U/L (ref 15–41)
Albumin: 4.6 g/dL (ref 3.5–5.0)
Alkaline Phosphatase: 81 U/L (ref 38–126)
Anion gap: 10 (ref 5–15)
BUN: 13 mg/dL (ref 6–20)
CO2: 27 mmol/L (ref 22–32)
Calcium: 9 mg/dL (ref 8.9–10.3)
Chloride: 99 mmol/L (ref 98–111)
Creatinine, Ser: 0.54 mg/dL (ref 0.44–1.00)
GFR, Estimated: >60 mL/min (ref >60)
Glucose, Bld: 124 mg/dL — ABNORMAL HIGH (ref 70–99)
Potassium: 3.4 mmol/L — ABNORMAL LOW (ref 3.5–5.1)
Sodium: 136 mmol/L (ref 135–145)
Total Bilirubin: 0.9 mg/dL (ref 0.3–1.2)
Total Protein: 8.3 g/dL — ABNORMAL HIGH (ref 6.5–8.1)

## 2020-12-21 LAB — LIPASE, BLOOD: Lipase: 41 U/L (ref 11–51)

## 2020-12-21 LAB — CBC
HCT: 35.9 % — ABNORMAL LOW (ref 36.0–46.0)
Hemoglobin: 12.2 g/dL (ref 12.0–15.0)
MCH: 29.6 pg (ref 26.0–34.0)
MCHC: 34 g/dL (ref 30.0–36.0)
MCV: 87.1 fL (ref 80.0–100.0)
Platelets: 300 10*3/uL (ref 150–400)
RBC: 4.12 MIL/uL (ref 3.87–5.11)
RDW: 12.1 % (ref 11.5–15.5)
WBC: 10.8 10*3/uL — ABNORMAL HIGH (ref 4.0–10.5)
nRBC: 0 % (ref 0.0–0.2)

## 2020-12-21 LAB — RESP PANEL BY RT-PCR (FLU A&B, COVID) ARPGX2
Influenza A by PCR: NEGATIVE
Influenza B by PCR: NEGATIVE
SARS Coronavirus 2 by RT PCR: NEGATIVE

## 2020-12-21 LAB — POC URINE PREG, ED: Preg Test, Ur: NEGATIVE

## 2020-12-21 LAB — PREGNANCY, URINE: Preg Test, Ur: NEGATIVE

## 2020-12-21 SURGERY — CHOLECYSTECTOMY, ROBOT-ASSISTED, LAPAROSCOPIC
Anesthesia: General

## 2020-12-21 MED ORDER — OXYCODONE HCL 5 MG/5ML PO SOLN: 5.0000 mg | Freq: Once | ORAL | Status: DC | PRN

## 2020-12-21 MED ORDER — PROMETHAZINE HCL 25 MG/ML IJ SOLN: 6.2500 mg | INTRAMUSCULAR | Status: DC | PRN

## 2020-12-21 MED ORDER — FENTANYL CITRATE (PF) 100 MCG/2ML IJ SOLN
INTRAMUSCULAR | Status: DC | PRN
  Administered 2020-12-21 (×2): 50 ug via INTRAVENOUS

## 2020-12-21 MED ORDER — ONDANSETRON 4 MG PO TBDP: 4.0000 mg | ORAL_TABLET | Freq: Four times a day (QID) | ORAL | Status: DC | PRN

## 2020-12-21 MED ORDER — ONDANSETRON HCL 4 MG/2ML IJ SOLN: 4.0000 mg | Freq: Four times a day (QID) | INTRAMUSCULAR | Status: DC | PRN

## 2020-12-21 MED ORDER — SUCCINYLCHOLINE CHLORIDE 20 MG/ML IJ SOLN
INTRAMUSCULAR | Status: DC | PRN
  Administered 2020-12-21: 100 mg via INTRAVENOUS

## 2020-12-21 MED ORDER — ACETAMINOPHEN 10 MG/ML IV SOLN
INTRAVENOUS | Status: AC
  Filled 2020-12-21: qty 100

## 2020-12-21 MED ORDER — MIDAZOLAM HCL 2 MG/2ML IJ SOLN
INTRAMUSCULAR | Status: AC
  Filled 2020-12-21: qty 2

## 2020-12-21 MED ORDER — ENOXAPARIN SODIUM 40 MG/0.4ML ~~LOC~~ SOLN: 40.0000 mg | SUBCUTANEOUS | Status: DC

## 2020-12-21 MED ORDER — ONDANSETRON HCL 4 MG/2ML IJ SOLN
4.0000 mg | Freq: Four times a day (QID) | INTRAMUSCULAR | Status: DC | PRN
  Administered 2020-12-21: 4 mg via INTRAVENOUS
  Filled 2020-12-21: qty 2

## 2020-12-21 MED ORDER — ONDANSETRON HCL 4 MG/2ML IJ SOLN
INTRAMUSCULAR | Status: DC | PRN
  Administered 2020-12-21: 4 mg via INTRAVENOUS

## 2020-12-21 MED ORDER — SODIUM CHLORIDE 0.9 % IV SOLN: INTRAVENOUS | Status: DC

## 2020-12-21 MED ORDER — PANTOPRAZOLE SODIUM 40 MG PO TBEC
40.0000 mg | DELAYED_RELEASE_TABLET | Freq: Every day | ORAL | Status: DC
  Filled 2020-12-21: qty 1

## 2020-12-21 MED ORDER — FENTANYL CITRATE (PF) 100 MCG/2ML IJ SOLN
INTRAMUSCULAR | Status: AC
  Filled 2020-12-21: qty 2

## 2020-12-21 MED ORDER — PIPERACILLIN-TAZOBACTAM 3.375 G IVPB
3.3750 g | Freq: Three times a day (TID) | INTRAVENOUS | Status: DC
  Administered 2020-12-21: 3.375 g via INTRAVENOUS
  Filled 2020-12-21: qty 50

## 2020-12-21 MED ORDER — MICROFIBRILLAR COLL HEMOSTAT EX POWD
CUTANEOUS | Status: DC | PRN
  Administered 2020-12-21: 3 g via TOPICAL

## 2020-12-21 MED ORDER — ENOXAPARIN SODIUM 40 MG/0.4ML ~~LOC~~ SOLN: 0.5000 mg/kg | SUBCUTANEOUS | Status: DC

## 2020-12-21 MED ORDER — MORPHINE SULFATE (PF) 4 MG/ML IV SOLN
4.0000 mg | INTRAVENOUS | Status: DC | PRN
  Administered 2020-12-22: 4 mg via INTRAVENOUS

## 2020-12-21 MED ORDER — PHENYLEPHRINE HCL (PRESSORS) 10 MG/ML IV SOLN
INTRAVENOUS | Status: DC | PRN
  Administered 2020-12-21: 100 ug via INTRAVENOUS
  Administered 2020-12-21: 50 ug via INTRAVENOUS

## 2020-12-21 MED ORDER — SODIUM CHLORIDE 0.9 % IV SOLN: INTRAVENOUS | Status: DC | PRN

## 2020-12-21 MED ORDER — HYDROCODONE-ACETAMINOPHEN 5-325 MG PO TABS
1.0000 | ORAL_TABLET | ORAL | Status: DC | PRN
Start: 2020-12-21 — End: 2020-12-22
  Administered 2020-12-22: 1 via ORAL
  Administered 2020-12-22: 2 via ORAL

## 2020-12-21 MED ORDER — PIPERACILLIN-TAZOBACTAM 3.375 G IVPB: 3.3750 g | Freq: Three times a day (TID) | INTRAVENOUS | Status: DC

## 2020-12-21 MED ORDER — SUGAMMADEX SODIUM 200 MG/2ML IV SOLN
INTRAVENOUS | Status: DC | PRN
  Administered 2020-12-21: 157.8 mg via INTRAVENOUS

## 2020-12-21 MED ORDER — ACETAMINOPHEN 10 MG/ML IV SOLN
INTRAVENOUS | Status: DC | PRN
  Administered 2020-12-21: 1000 mg via INTRAVENOUS

## 2020-12-21 MED ORDER — MIDAZOLAM HCL 2 MG/2ML IJ SOLN
INTRAMUSCULAR | Status: DC | PRN
  Administered 2020-12-21: 2 mg via INTRAVENOUS

## 2020-12-21 MED ORDER — INDOCYANINE GREEN 25 MG IV SOLR
1.2500 mg | Freq: Once | INTRAVENOUS | Status: AC
  Administered 2020-12-21: 1.25 mg via INTRAVENOUS
  Filled 2020-12-21: qty 0.5

## 2020-12-21 MED ORDER — LIDOCAINE HCL (PF) 2 % IJ SOLN
INTRAMUSCULAR | Status: AC
  Filled 2020-12-21: qty 5

## 2020-12-21 MED ORDER — FIBRIN SEALANT 2 ML SINGLE DOSE KIT
PACK | CUTANEOUS | Status: DC | PRN
  Administered 2020-12-21: 2 mL via TOPICAL

## 2020-12-21 MED ORDER — PROPOFOL 10 MG/ML IV BOLUS
INTRAVENOUS | Status: AC
  Filled 2020-12-21: qty 20

## 2020-12-21 MED ORDER — ONDANSETRON HCL 4 MG/2ML IJ SOLN
INTRAMUSCULAR | Status: AC
  Filled 2020-12-21: qty 2

## 2020-12-21 MED ORDER — DEXAMETHASONE SODIUM PHOSPHATE 10 MG/ML IJ SOLN
INTRAMUSCULAR | Status: DC | PRN
  Administered 2020-12-21: 10 mg via INTRAVENOUS

## 2020-12-21 MED ORDER — FENTANYL CITRATE (PF) 100 MCG/2ML IJ SOLN
INTRAMUSCULAR | Status: AC
  Administered 2020-12-21: 50 ug via INTRAVENOUS
  Filled 2020-12-21: qty 2

## 2020-12-21 MED ORDER — ACETAMINOPHEN 325 MG PO TABS: 650.0000 mg | ORAL_TABLET | Freq: Four times a day (QID) | ORAL | Status: DC | PRN

## 2020-12-21 MED ORDER — PIPERACILLIN-TAZOBACTAM 3.375 G IVPB
INTRAVENOUS | Status: AC
  Administered 2020-12-21: 3.375 g via INTRAVENOUS
  Filled 2020-12-21: qty 50

## 2020-12-21 MED ORDER — LIDOCAINE HCL (CARDIAC) PF 100 MG/5ML IV SOSY
PREFILLED_SYRINGE | INTRAVENOUS | Status: DC | PRN
  Administered 2020-12-21: 80 mg via INTRAVENOUS

## 2020-12-21 MED ORDER — BUPIVACAINE-EPINEPHRINE 0.25% -1:200000 IJ SOLN
INTRAMUSCULAR | Status: DC | PRN
  Administered 2020-12-21: 15 mL

## 2020-12-21 MED ORDER — FENTANYL CITRATE (PF) 100 MCG/2ML IJ SOLN
25.0000 ug | INTRAMUSCULAR | Status: DC | PRN
  Administered 2020-12-21: 50 ug via INTRAVENOUS

## 2020-12-21 MED ORDER — MORPHINE SULFATE (PF) 4 MG/ML IV SOLN
4.0000 mg | INTRAVENOUS | Status: DC | PRN
  Administered 2020-12-21: 4 mg via INTRAVENOUS
  Filled 2020-12-21: qty 1

## 2020-12-21 MED ORDER — ROCURONIUM BROMIDE 100 MG/10ML IV SOLN
INTRAVENOUS | Status: DC | PRN
  Administered 2020-12-21: 10 mg via INTRAVENOUS
  Administered 2020-12-21: 50 mg via INTRAVENOUS

## 2020-12-21 MED ORDER — ENOXAPARIN SODIUM 300 MG/3ML IJ SOLN: 0.5000 mg/kg | INTRAMUSCULAR | Status: DC

## 2020-12-21 MED ORDER — HYDROCODONE-ACETAMINOPHEN 5-325 MG PO TABS: 1.0000 | ORAL_TABLET | ORAL | Status: DC | PRN

## 2020-12-21 MED ORDER — OXYCODONE HCL 5 MG PO TABS: 5.0000 mg | ORAL_TABLET | Freq: Once | ORAL | Status: DC | PRN

## 2020-12-21 MED ORDER — ACETAMINOPHEN 650 MG RE SUPP: 650.0000 mg | Freq: Four times a day (QID) | RECTAL | Status: DC | PRN

## 2020-12-21 MED ORDER — PROPOFOL 10 MG/ML IV BOLUS
INTRAVENOUS | Status: DC | PRN
  Administered 2020-12-21: 150 mg via INTRAVENOUS

## 2020-12-21 MED ORDER — SEVOFLURANE IN SOLN
RESPIRATORY_TRACT | Status: AC
  Filled 2020-12-21: qty 250

## 2020-12-21 SURGICAL SUPPLY — 53 items
APPLICATOR ARISTA FLEXITIP XL (MISCELLANEOUS) ×2 IMPLANT
APPLICATOR VISTASEAL FLEXIBLE (MISCELLANEOUS) ×2 IMPLANT
BAG INFUSER PRESSURE 100CC (MISCELLANEOUS) IMPLANT
BLADE SURG SZ11 CARB STEEL (BLADE) ×2 IMPLANT
CANISTER SUCT 1200ML W/VALVE (MISCELLANEOUS) ×2 IMPLANT
CANNULA REDUC XI 12-8 STAPL (CANNULA) ×1
CANNULA REDUCER 12-8 DVNC XI (CANNULA) ×1 IMPLANT
CHLORAPREP W/TINT 26 (MISCELLANEOUS) ×2 IMPLANT
CLIP VESOLOCK MED LG 6/CT (CLIP) ×2 IMPLANT
COVER WAND RF STERILE (DRAPES) ×2 IMPLANT
DECANTER SPIKE VIAL GLASS SM (MISCELLANEOUS) ×4 IMPLANT
DEFOGGER SCOPE WARMER CLEARIFY (MISCELLANEOUS) ×2 IMPLANT
DERMABOND ADVANCED (GAUZE/BANDAGES/DRESSINGS) ×1
DERMABOND ADVANCED .7 DNX12 (GAUZE/BANDAGES/DRESSINGS) ×1 IMPLANT
DRAPE ARM DVNC X/XI (DISPOSABLE) ×4 IMPLANT
DRAPE COLUMN DVNC XI (DISPOSABLE) ×1 IMPLANT
DRAPE DA VINCI XI ARM (DISPOSABLE) ×4
DRAPE DA VINCI XI COLUMN (DISPOSABLE) ×1
ELECT REM PT RETURN 9FT ADLT (ELECTROSURGICAL) ×2
ELECTRODE REM PT RTRN 9FT ADLT (ELECTROSURGICAL) ×1 IMPLANT
GLOVE SURG ENC MOIS LTX SZ6.5 (GLOVE) ×4 IMPLANT
GLOVE SURG UNDER POLY LF SZ6.5 (GLOVE) ×4 IMPLANT
GOWN STRL REUS W/ TWL LRG LVL3 (GOWN DISPOSABLE) ×3 IMPLANT
GOWN STRL REUS W/TWL LRG LVL3 (GOWN DISPOSABLE) ×3
GRASPER SUT TROCAR 14GX15 (MISCELLANEOUS) ×2 IMPLANT
HEMOSTAT ARISTA ABSORB 3G PWDR (HEMOSTASIS) ×2 IMPLANT
IRRIGATOR SUCT 8 DISP DVNC XI (IRRIGATION / IRRIGATOR) ×1 IMPLANT
IRRIGATOR SUCTION 8MM XI DISP (IRRIGATION / IRRIGATOR) ×1
IV NS 1000ML (IV SOLUTION) ×1
IV NS 1000ML BAXH (IV SOLUTION) ×1 IMPLANT
KIT PINK PAD W/HEAD ARE REST (MISCELLANEOUS) ×2
KIT PINK PAD W/HEAD ARM REST (MISCELLANEOUS) ×1 IMPLANT
LABEL OR SOLS (LABEL) ×2 IMPLANT
MANIFOLD NEPTUNE II (INSTRUMENTS) ×2 IMPLANT
NEEDLE HYPO 22GX1.5 SAFETY (NEEDLE) ×2 IMPLANT
NEEDLE INSUFFLATION 14GA 120MM (NEEDLE) ×2 IMPLANT
NS IRRIG 500ML POUR BTL (IV SOLUTION) ×2 IMPLANT
OBTURATOR OPTICAL STANDARD 8MM (TROCAR) ×1
OBTURATOR OPTICAL STND 8 DVNC (TROCAR) ×1
OBTURATOR OPTICALSTD 8 DVNC (TROCAR) ×1 IMPLANT
PACK LAP CHOLECYSTECTOMY (MISCELLANEOUS) ×2 IMPLANT
POUCH SPECIMEN RETRIEVAL 10MM (ENDOMECHANICALS) ×2 IMPLANT
SEAL CANN UNIV 5-8 DVNC XI (MISCELLANEOUS) ×3 IMPLANT
SEAL XI 5MM-8MM UNIVERSAL (MISCELLANEOUS) ×3
SET TUBE SMOKE EVAC HIGH FLOW (TUBING) ×2 IMPLANT
SOLUTION ELECTROLUBE (MISCELLANEOUS) ×2 IMPLANT
SPONGE LAP 4X18 RFD (DISPOSABLE) ×2 IMPLANT
STAPLER CANNULA SEAL DVNC XI (STAPLE) ×1 IMPLANT
STAPLER CANNULA SEAL XI (STAPLE) ×1
SUT MNCRL 4-0 (SUTURE) ×1
SUT MNCRL 4-0 27XMFL (SUTURE) ×1
SUT VICRYL 0 AB UR-6 (SUTURE) ×4 IMPLANT
SUTURE MNCRL 4-0 27XMF (SUTURE) ×1 IMPLANT

## 2020-12-21 NOTE — ED Notes (Signed)
See triage note presents with some generalized abd pain  States she had pain couple of weeks ago  The pain eased off and then returned this week  Was seen at [facility] for same   Placed on Prilosec  States pain is burning type pain  Also having some lower back cramping "like labor pains"  No fever

## 2020-12-21 NOTE — ED Triage Notes (Signed)
Went to [facility] 2 days ago and dx gerd.  Says still with upper mid abdominal pain.  Has been taking omeprazol.

## 2020-12-21 NOTE — Anesthesia Preprocedure Evaluation (Signed)
Anesthesia Evaluation  Patient identified by MRN, date of birth, ID band Patient awake    Reviewed: Allergy & Precautions, H&P , NPO status , Patient's Chart, lab work & pertinent test results  History of Anesthesia Complications Negative for: history of anesthetic complications  Airway Mallampati: III  TM Distance: <3 FB Neck ROM: full    Dental  (+) Chipped, Caps   Pulmonary neg pulmonary ROS, neg shortness of breath,    Pulmonary exam normal        Cardiovascular Exercise Tolerance: Good hypertension, (-) angina(-) Past MI Normal cardiovascular exam     Neuro/Psych negative neurological ROS  negative psych ROS   GI/Hepatic Neg liver ROS, GERD  Medicated and Controlled,  Endo/Other  negative endocrine ROS  Renal/GU      Musculoskeletal   Abdominal   Peds  Hematology negative hematology ROS (+)   Anesthesia Other Findings Past Medical History: No date: GERD (gastroesophageal reflux disease) No date: Hypertension  History reviewed. No pertinent surgical history.  BMI    Body Mass Index: 40.44 kg/m      Reproductive/Obstetrics negative OB ROS                             Anesthesia Physical Anesthesia Plan  ASA: III  Anesthesia Plan: General ETT   Post-op Pain Management:    Induction: Intravenous  PONV Risk Score and Plan: Ondansetron, Dexamethasone, Midazolam and Treatment may vary due to age or medical condition  Airway Management Planned: Oral ETT  Additional Equipment:   Intra-op Plan:   Post-operative Plan: Extubation in OR  Informed Consent: I have reviewed the patients History and Physical, chart, labs and discussed the procedure including the risks, benefits and alternatives for the proposed anesthesia with the patient or authorized representative who has indicated his/her understanding and acceptance.     Dental Advisory Given  Plan Discussed with:  Anesthesiologist, CRNA and Surgeon  Anesthesia Plan Comments: (History and consent via interpreter   Patient consented for risks of anesthesia including but not limited to:  - adverse reactions to medications - damage to eyes, teeth, lips or other oral mucosa - nerve damage due to positioning  - sore throat or hoarseness - Damage to heart, brain, nerves, lungs, other parts of body or loss of life  Patient voiced understanding.)        Anesthesia Quick Evaluation

## 2020-12-21 NOTE — Transfer of Care (Signed)
Immediate Anesthesia Transfer of Care Note  Patient: Jessica Bright  Procedure(s) Performed: XI ROBOTIC ASSISTED LAPAROSCOPIC CHOLECYSTECTOMY (N/A )  Patient Location: PACU  Anesthesia Type:General  Level of Consciousness: sedated  Airway & Oxygen Therapy: Patient Spontanous Breathing and Patient connected to face mask oxygen  Post-op Assessment: Post -op Vital signs reviewed and stable  Post vital signs: stable  Last Vitals:  Vitals Value Taken Time  BP 136/85 12/21/20 2000  Temp 36.1 C 12/21/20 2000  Pulse 90 12/21/20 2021  Resp 16 12/21/20 2021  SpO2 100 % 12/21/20 2021  Vitals shown include unvalidated device data.  Last Pain:  Vitals:   12/21/20 1433  TempSrc: Temporal  PainSc: 6          Complications: No complications documented.

## 2020-12-21 NOTE — ED Provider Notes (Signed)
Jessica Bright, attending physician, personally viewed and interpreted this EKG  EKG Time: 1119 Rate: 78 Rhythm: normal sinus rhythm Axis: normal Intervals: qtc 426 QRS: narrow ST changes: no st elevation Impression: normal ekg    Phineas Semen, MD 12/21/20 1124

## 2020-12-21 NOTE — H&P (Signed)
SURGICAL CONSULTATION NOTE   HISTORY OF PRESENT ILLNESS (HPI):  45 y.o. female presented to [facility] ED for evaluation of since last night. Patient reports having pain in the right upper quadrant since last night.  She reported the pain is in the right lower quadrant.  Pain does not radiate to the part of the body.  Pain has been aggravated by oral intake.  There has been no alleviating factors.  Patient denies any fever or chills.  At the ED she has ultrasound of the abdomen that shows gallbladder wall thickening and pericholecystic fluid with cholelithiasis.  These findings are consistent with cholecystitis.  I personally evaluated the images.  Surgery is consulted by Dr. Derrill Kay in this context for evaluation and management of acute cholecystitis.  PAST MEDICAL HISTORY (PMH):  Past Medical History:  Diagnosis Date  . GERD (gastroesophageal reflux disease)   . Hypertension      PAST SURGICAL HISTORY (PSH):  History reviewed. No pertinent surgical history.   MEDICATIONS:  Prior to Admission medications   Medication Sig Start Date End Date Taking? Authorizing Provider  omeprazole (PRILOSEC) 20 MG capsule Take 20 mg by mouth daily.   Yes [provider]     ALLERGIES:  No Known Allergies   SOCIAL HISTORY:  Social History   Socioeconomic History  . Marital status: Single    Spouse name: Not on file  . Number of children: Not on file  . Years of education: Not on file  . Highest education level: Not on file  Occupational History  . Not on file  Tobacco Use  . Smoking status: Never Smoker  . Smokeless tobacco: Never Used  Substance and Sexual Activity  . Alcohol use: No  . Drug use: Not on file  . Sexual activity: Not on file  Other Topics Concern  . Not on file  Social History Narrative  . Not on file   Social Determinants of Health   Financial Resource Strain: Not on file  Food Insecurity: Not on file  Transportation Needs: Not on file  Physical Activity:  Not on file  Stress: Not on file  Social Connections: Not on file  Intimate Partner Violence: Not on file     FAMILY HISTORY:  History reviewed. No pertinent family history.   REVIEW OF SYSTEMS:  Constitutional: denies weight loss, fever, chills, or sweats  Eyes: denies any other vision changes, history of eye injury  ENT: denies sore throat, hearing problems  Respiratory: denies shortness of breath, wheezing  Cardiovascular: denies chest pain, palpitations  Gastrointestinal: positive abdominal pain, Denies nausea and vomitnig Genitourinary: denies burning with urination or urinary frequency Musculoskeletal: denies any other joint pains or cramps  Skin: denies any other rashes or skin discolorations  Neurological: denies any other headache, dizziness, weakness  Psychiatric: denies any other depression, anxiety   All other review of systems were negative   VITAL SIGNS:  Temp:  [98.6 F (37 C)] 98.6 F (37 C) (03/03 0751) Pulse Rate:  [85-87] 87 (03/03 1056) Resp:  [16-20] 20 (03/03 1056) BP: (159-177)/(100-106) 177/106 (03/03 1056) SpO2:  [99 %-100 %] 99 % (03/03 1056) Weight:  [78.9 kg-79 kg] 79 kg (03/03 0918)     Height: 5' (152.4 cm) Weight: 79 kg BMI (Calculated): 34.01   INTAKE/OUTPUT:  This shift: No intake/output data recorded.  Last 2 shifts: @IOLAST2SHIFTS @   PHYSICAL EXAM:  Constitutional:  -- Normal body habitus  -- Awake, alert, and oriented x3  Eyes:  -- Pupils equally  round and reactive to light  -- No scleral icterus  Ear, nose, and throat:  -- No jugular venous distension  Pulmonary:  -- No crackles  -- Equal breath sounds bilaterally -- Breathing non-labored at rest Cardiovascular:  -- S1, S2 present  -- No pericardial rubs Gastrointestinal:  -- Abdomen soft, tender in the right upper quadrant., non-distended, no guarding or rebound tenderness -- No abdominal masses appreciated, pulsatile or otherwise  Musculoskeletal and Integumentary:  --  Wounds: None appreciated -- Extremities: B/L UE and LE FROM, hands and feet warm, no edema  Neurologic:  -- Motor function: intact and symmetric -- Sensation: intact and symmetric   Labs:  CBC Latest Ref Rng & Units 12/21/2020 02/13/2013 02/12/2013  WBC 4.0 - 10.5 K/uL 10.8(H) - 8.8  Hemoglobin 12.0 - 15.0 g/dL 38.1 - 10.7(L)  Hematocrit 36.0 - 46.0 % 35.9(L) 26.4(L) 31.6(L)  Platelets 150 - 400 K/uL 300 - 223   CMP Latest Ref Rng & Units 12/21/2020 02/12/2013 01/18/2012  Glucose 70 - 99 mg/dL 017(P) 83 102(H)  BUN 6 - 20 mg/dL 13 8 12   Creatinine 0.44 - 1.00 mg/dL 8.52) 7.78(E  Sodium 135 - 145 mmol/L 136 138 140  Potassium 3.5 - 5.1 mmol/L 3.4(L) 3.9 3.5  Chloride 98 - 111 mmol/L 99 105 105  CO2 22 - 32 mmol/L 27 29 22   Calcium 8.9 - 10.3 mg/dL 9.0 4.23) 8.3(L)  Total Protein 6.5 - 8.1 g/dL 8.3(H) - 8.9(H)  Total Bilirubin 0.3 - 1.2 mg/dL 0.9 - 0.7  Alkaline Phos 38 - 126 U/L 81 - 113  AST 15 - 41 U/L 21 26 51(H)  ALT 0 - 44 U/L 23 - 67    Imaging studies:  EXAM: ULTRASOUND ABDOMEN LIMITED RIGHT UPPER QUADRANT  COMPARISON:  None.  FINDINGS: Gallbladder:  2.5 cm gallstone towards the fundus. 1.8 cm fixed shadowing stone at the gallbladder neck. Gallbladder wall thickening to 5 mm with possible pericholecystic edema that is mild. Focal tenderness per sonographer exam  Common bile duct:  Diameter: 3 mm  Liver:  Echogenic liver. Portal vein is patent on color Doppler imaging with normal direction of blood flow towards the liver.  Other: None.  IMPRESSION: 1. Cholelithiasis, gallbladder wall thickening, and focal tenderness suggesting acute cholecystitis. 2. Hepatic steatosis.   Electronically Signed   By: M.D.   On: 12/21/2020 10:41  Assessment/Plan:  45 y.o. female with acute cholecystitis.  Patient with history, physical exam and images consistent with acute cholecystitis. Patient oriented about diagnosis and surgical  management as treatment.   Discussed the risk of surgery including post-op infxn, seroma, biloma, chronic pain, poor-delayed wound healing, retained gallstone, conversion to open procedure, post-op SBO or ileus, and need for additional procedures to address said risks.  The risks of general anesthetic including MI, CVA, sudden death or even reaction to anesthetic medications also discussed. Alternatives include continued observation.  Benefits include possible symptom relief, prevention of complications including acute cholecystitis, pancreatitis.  02/20/2021, MD

## 2020-12-21 NOTE — ED Provider Notes (Signed)
[facility] Emergency Department Provider Note  ____________________________________________   Event Date/Time   First MD Initiated Contact with Patient 12/21/20 0930     (approximate)  I have reviewed the triage vital signs and the nursing notes.   HISTORY  Chief Complaint Abdominal Pain    HPI Merry Pond is a 45 y.o. female presents emergency department complaining of abdominal pain.  Patient states pain started couple weeks ago.  Eased off and has returned this week.  Was seen at [facility] clinic and given omeprazole.  Pain is burning and spasming like labor pains.  No vomiting.    Past Medical History:  Diagnosis Date  . GERD (gastroesophageal reflux disease)     There are no problems to display for this patient.   No past surgical history on file.  Prior to Admission medications   Medication Sig Start Date End Date Taking? Authorizing Provider  omeprazole (PRILOSEC) 20 MG capsule Take 20 mg by mouth daily.   Yes [provider]    Allergies Patient has no known allergies.  No family history on file.  Social History Social History   Tobacco Use  . Smoking status: Never Smoker  . Smokeless tobacco: Never Used  Substance Use Topics  . Alcohol use: No    Review of Systems  Constitutional: No fever/chills Eyes: No visual changes. ENT: No sore throat. Respiratory: Denies cough Cardiovascular: Denies chest pain Gastrointestinal: Positive abdominal pain Genitourinary: Negative for dysuria. Musculoskeletal: Negative for back pain. Skin: Negative for rash. Psychiatric: no mood changes,     ____________________________________________   PHYSICAL EXAM:  VITAL SIGNS: ED Triage Vitals  Enc Vitals Group     BP 12/21/20 0925 (!) 159/100     Pulse Rate 12/21/20 0751 86     Resp 12/21/20 0751 16     Temp 12/21/20 0751 98.6 F (37 C)     Temp Source 12/21/20 0751 Oral     SpO2 12/21/20 0751 100 %     Weight  12/21/20 0752 174 lb (78.9 kg)     Height 12/21/20 0918 5' (1.524 m)     Head Circumference --      Peak Flow --      Pain Score 12/21/20 0751 10     Pain Loc --      Pain Edu? --      Excl. in GC? --     Constitutional: Alert and oriented. Well appearing and in no acute distress. Eyes: Conjunctivae are normal.  Head: Atraumatic. Nose: No congestion/rhinnorhea. Mouth/Throat: Mucous membranes are moist.   Neck:  supple no lymphadenopathy noted Cardiovascular: Normal rate, regular rhythm. Heart sounds are normal Respiratory: Normal respiratory effort.  No retractions, lungs c t a  Abd: soft tender in the right upper quadrant, bs normal all 4 quad GU: deferred Musculoskeletal: FROM all extremities, warm and well perfused Neurologic:  Normal speech and language.  Skin:  Skin is warm, dry and intact. No rash noted. Psychiatric: Mood and affect are normal. Speech and behavior are normal.  ____________________________________________   LABS (all labs ordered are listed, but only abnormal results are displayed)  Labs Reviewed  COMPREHENSIVE METABOLIC PANEL - Abnormal; Notable for the following components:      Result Value   Potassium 3.4 (*)    Glucose, Bld 124 (*)    Total Protein 8.3 (*)    All other components within normal limits  CBC - Abnormal; Notable for the following components:   WBC 10.8 (*)  HCT 35.9 (*)    All other components within normal limits  URINALYSIS, COMPLETE (UACMP) WITH MICROSCOPIC - Abnormal; Notable for the following components:   Color, Urine YELLOW (*)    APPearance CLOUDY (*)    pH 9.0 (*)    Bacteria, UA RARE (*)    All other components within normal limits  RESP PANEL BY RT-PCR (FLU A&B, COVID) ARPGX2  LIPASE, BLOOD  PREGNANCY, URINE  POC URINE PREG, ED   ____________________________________________   ____________________________________________  RADIOLOGY  Ultrasound right upper  quadrant  ____________________________________________   PROCEDURES  Procedure(s) performed: No  Procedures    ____________________________________________   INITIAL IMPRESSION / ASSESSMENT AND PLAN / ED COURSE  Pertinent labs & imaging results that were available during my care of the patient were reviewed by me and considered in my medical decision making (see chart for details).   Patient is 45 year old female presents with right upper quadrant pain.  See HPI.  Physical exam shows patient appears stable, positive Murphy sign  DDx: Acute abdominal pain, acute cholecystitis, cholelithiasis, kidney stone, pancreatitis  CBC has elevated WBC of 10.8, metabolic panel is basically normal, urinalysis has a pH of 9 which is abnormal, lipase is normal, POC pregnancy is negative  Ultrasound right upper quadrant to rule out acute cholecystitis   Ultrasound shows acute cholecystitis.  Paged Dr. Maia Plan.  He is going to evaluate patient  Dr. Maia Plan evaluated patient.  Will be going to surgery today.  Patient is to remain n.p.o.  Covid swab sent  Jameka Ivie was evaluated in Emergency Department on 12/21/2020 for the symptoms described in the history of present illness. She was evaluated in the context of the global COVID-19 pandemic, which necessitated consideration that the patient might be at risk for infection with the SARS-CoV-2 virus that causes COVID-19. Institutional protocols and algorithms that pertain to the evaluation of patients at risk for COVID-19 are in a state of rapid change based on information released by regulatory bodies including the CDC and federal and state organizations. These policies and algorithms were followed during the patient's care in the ED.    As part of my medical decision making, I reviewed the following data within the electronic MEDICAL RECORD NUMBER Nursing notes reviewed and incorporated, Labs reviewed , Old chart reviewed, Radiograph reviewed , A  consult was requested and obtained from this/these consultant(s) Surgery, Notes from prior ED visits and [place] Controlled Substance Database  ____________________________________________   FINAL CLINICAL IMPRESSION(S) / ED DIAGNOSES  Final diagnoses:  RUQ pain  Acute cholecystitis      NEW MEDICATIONS STARTED DURING THIS VISIT:  New Prescriptions   No medications on file     Note:  This document was prepared using Dragon voice recognition software and may include unintentional dictation errors.    Faythe Ghee, PA-C 12/21/20 1055    Phineas Semen, MD 12/21/20 1125

## 2020-12-21 NOTE — ED Notes (Signed)
Consulting MD in with pt

## 2020-12-21 NOTE — Op Note (Signed)
Preoperative diagnosis: Acute cholecystitis  Postoperative diagnosis: Acute cholecystitis  Procedure: Robotic Assisted Laparoscopic Cholecystectomy.   Anesthesia: GETA   Surgeon: Dr. Hazle Quant  Wound Classification: Clean Contaminated  Indications: Patient is a 45 y.o. female developed right upper quadrant pain, nausea, vomiting and leukocytosis and on workup was found to have cholelithiasis with a normal common duct and cholecystitis. Robotic Assisted Laparoscopic cholecystectomy was elected.  Findings: Severe pericholecystic edema and gallbladder wall thickening  Critical view of safety achieved Cystic duct and artery identified, ligated and divided Adequate hemostasis  Description of procedure: The patient was placed on the operating table in the supine position. General anesthesia was induced. A time-out was completed verifying correct patient, procedure, site, positioning, and implant(s) and/or special equipment prior to beginning this procedure. An orogastric tube was placed. The abdomen was prepped and draped in the usual sterile fashion.  An incision was made in a natural skin line below the umbilicus.  The fascia was elevated and the Veress needle inserted. Proper position was confirmed by aspiration and saline meniscus test.  The abdomen was insufflated with carbon dioxide to a pressure of 15 mmHg. The patient tolerated insufflation well. A 8-mm trocar was then inserted in optiview fashion.  The laparoscope was inserted and the abdomen inspected. No injuries from initial trocar placement were noted. Additional trocars were then inserted in the following locations: an 8-mm trocar in the left lateral abdomen, and another two 8-mm trocars to the right side of the abdomen 5 cm appart. The umbilical trocar was changed to a 12 mm trocar all under direct visualization. The abdomen was inspected and no abnormalities were found. The table was placed in the reverse Trendelenburg position  with the right side up. The robotic arms were docked and target anatomy identified. Instrument inserted under direct visualization.  Filmy adhesions between the gallbladder and omentum, duodenum and transverse colon were lysed with electrocautery. The dome of the gallbladder was grasped with a prograsp and retracted over the dome of the liver. The infundibulum was also grasped with an atraumatic grasper and retracted toward the right lower quadrant. This maneuver exposed Calot's triangle. A very difficult and time consuming dissection of the peritoneum overlying the gallbladder infundibulum was then incised and the cystic duct and cystic artery identified and circumferentially dissected. Critical view of safety reviewed before ligating any structure. Firefly images taken to visualize biliary ducts. The cystic duct and cystic artery were then doubly clipped and divided close to the gallbladder.  The gallbladder was then dissected from its peritoneal attachments by electrocautery. Hemostasis was checked and the gallbladder and contained stones were removed using an endoscopic retrieval bag. The gallbladder was passed off the table as a specimen. The gallbladder fossa was copiously irrigated with saline and hemostasis was obtained. There was no evidence of bleeding from the gallbladder fossa or cystic artery or leakage of the bile from the cystic duct stump. Secondary trocars were removed under direct vision. No bleeding was noted. The robotic arms were undoked. The scope was withdrawn and the umbilical trocar removed. The abdomen was allowed to collapse. The fascia of the 13mm trocar sites was closed with figure-of-eight 0 vicryl sutures. The skin was closed with subcuticular sutures of 4-0 monocryl and topical skin adhesive. The orogastric tube was removed.  The patient tolerated the procedure well and was taken to the postanesthesia care unit in stable condition.   Specimen: Gallbladder  Complications:  None  EBL: 25 mL

## 2020-12-21 NOTE — Anesthesia Procedure Notes (Signed)
Procedure Name: Intubation Date/Time: 12/21/2020 5:56 PM Performed by: Hezzie Bump, CRNA Pre-anesthesia Checklist: Patient identified, Patient being monitored, Timeout performed, Emergency Drugs available and Suction available Patient Re-evaluated:Patient Re-evaluated prior to induction Oxygen Delivery Method: Circle system utilized Preoxygenation: Pre-oxygenation with 100% oxygen Induction Type: IV induction Ventilation: Mask ventilation without difficulty Laryngoscope Size: 3 and McGraph Grade View: Grade I Tube type: Oral Tube size: 7.0 mm Number of attempts: 1 Airway Equipment and Method: Stylet and Video-laryngoscopy Placement Confirmation: ETT inserted through vocal cords under direct vision,  positive ETCO2 and breath sounds checked- equal and bilateral Secured at: 21 cm Tube secured with: Tape Dental Injury: Teeth and Oropharynx as per pre-operative assessment

## 2020-12-22 MED ORDER — HYDROCODONE-ACETAMINOPHEN 5-325 MG PO TABS
ORAL_TABLET | ORAL | Status: AC
  Filled 2020-12-22: qty 1

## 2020-12-22 MED ORDER — HYDROCODONE-ACETAMINOPHEN 5-325 MG PO TABS
ORAL_TABLET | ORAL | Status: AC
  Filled 2020-12-22: qty 2

## 2020-12-22 MED ORDER — HYDROCODONE-ACETAMINOPHEN 5-325 MG PO TABS: 1.0000 | ORAL_TABLET | ORAL | 0 refills | Status: AC | PRN

## 2020-12-22 MED ORDER — MORPHINE SULFATE (PF) 4 MG/ML IV SOLN
INTRAVENOUS | Status: AC
  Filled 2020-12-22: qty 1

## 2020-12-22 MED ORDER — PIPERACILLIN-TAZOBACTAM 3.375 G IVPB
INTRAVENOUS | Status: AC
  Administered 2020-12-22: 3.375 g via INTRAVENOUS
  Filled 2020-12-22: qty 50

## 2020-12-22 MED ORDER — MORPHINE SULFATE (PF) 4 MG/ML IV SOLN
INTRAVENOUS | Status: AC
  Administered 2020-12-22: 4 mg via INTRAVENOUS
  Filled 2020-12-22: qty 1

## 2020-12-22 NOTE — Discharge Summary (Signed)
  Patient ID: Jessica Bright MRN: [id number] DOB/AGE: 1976-06-13 45 y.o.  Admit date: 12/21/2020 Discharge date: 12/22/2020   Discharge Diagnoses:  Active Problems:   Acute cholecystitis   Procedures: Robotic assisted laparoscopic cholecystectomy   Hospital Course: Patient with acute cholecystitis. She underwent robotic assisted laparoscopic cholecystectomy. Tolerated procedure well. Today ambulating, eating and pain controlled.   Physical Exam Cardiovascular:     Rate and Rhythm: Normal rate and regular rhythm.  Pulmonary:     Effort: Pulmonary effort is normal.     Breath sounds: Normal breath sounds.  Abdominal:     General: Abdomen is flat. Bowel sounds are normal.     Palpations: Abdomen is soft.  Neurological:     Mental Status: She is alert and oriented to person, place, and time.     Consults: None  Disposition: Discharge disposition: 01-Home or Self Care       Discharge Instructions    Diet - low sodium heart healthy   Complete by: As directed    Increase activity slowly   Complete by: As directed      Allergies as of 12/22/2020   No Known Allergies     Medication List    TAKE these medications   HYDROcodone-acetaminophen 5-325 MG tablet Commonly known as: Norco Take 1 tablet by mouth every 4 (four) hours as needed for up to 3 days for moderate pain.   omeprazole 20 MG capsule Commonly known as: PRILOSEC Take 20 mg by mouth daily.

## 2020-12-22 NOTE — Discharge Instructions (Signed)

## 2020-12-25 LAB — SURGICAL PATHOLOGY

## 2021-01-01 NOTE — Anesthesia Postprocedure Evaluation (Signed)
Anesthesia Post Note  Patient: Calin Ellery  Procedure(s) Performed: XI ROBOTIC ASSISTED LAPAROSCOPIC CHOLECYSTECTOMY (N/A )  Patient location during evaluation: PACU Anesthesia Type: General Level of consciousness: awake and alert Pain management: pain level controlled Vital Signs Assessment: post-procedure vital signs reviewed and stable Respiratory status: spontaneous breathing, nonlabored ventilation, respiratory function stable and patient connected to nasal cannula oxygen Cardiovascular status: blood pressure returned to baseline and stable Postop Assessment: no apparent nausea or vomiting Anesthetic complications: no   No complications documented.   Last Vitals:  Vitals:   12/22/20 0810 12/22/20 1157  BP: (!) 142/87 137/73  Pulse: 81 72  Resp: 18 18  Temp:  36.7 C  SpO2: 98% 98%    Last Pain:  Vitals:   12/22/20 1157  TempSrc: Oral  PainSc: 5                  Cleda Mccreedy Almin [facility]

## 2021-12-04 IMAGING — US US ABDOMEN LIMITED RUQ/ASCITES
1 series · 14 of 25 positions shown · non-contrast
Comparison: None.

CLINICAL DATA: Right upper quadrant pain for 2 weeks

EXAM:
ULTRASOUND ABDOMEN LIMITED RIGHT UPPER QUADRANT

[Series 1: us abdomen limited ruq (liver/gb) · 14 of 48 slices shown]
[im 1/48]
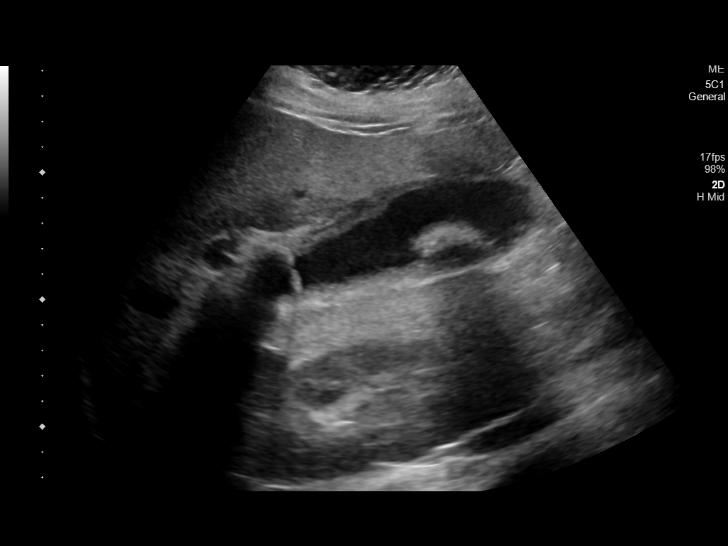
[im 4/48]
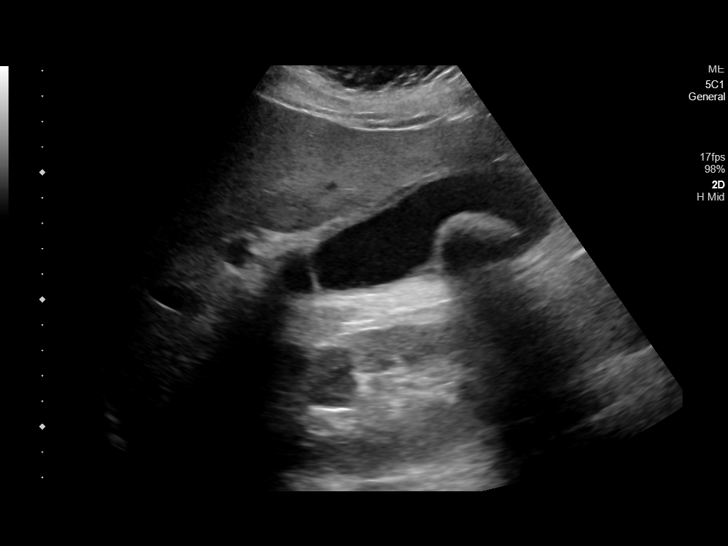
[im 8/48]
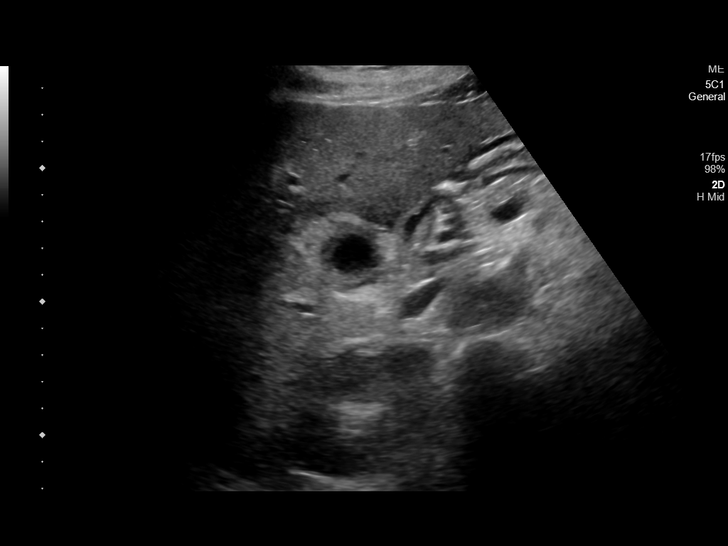
[im 12/48]
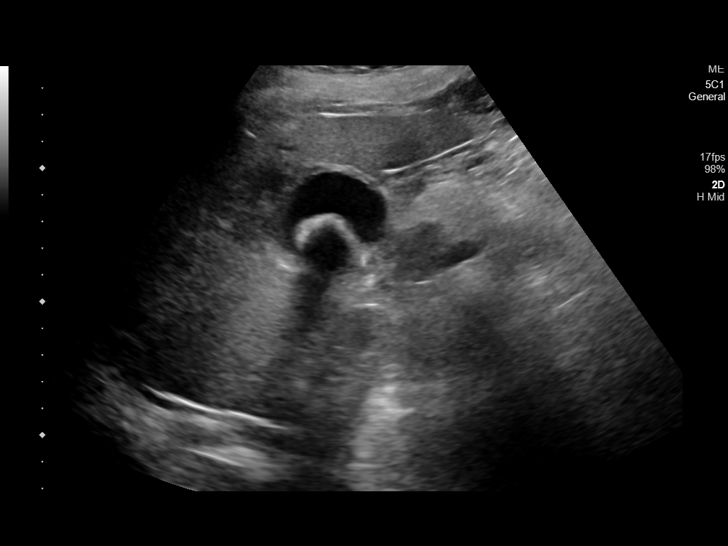
[im 16/48]
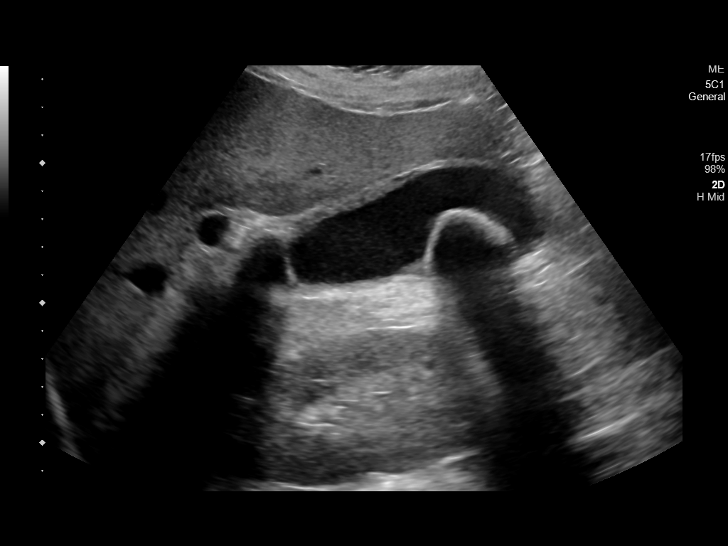
[im 18/48]
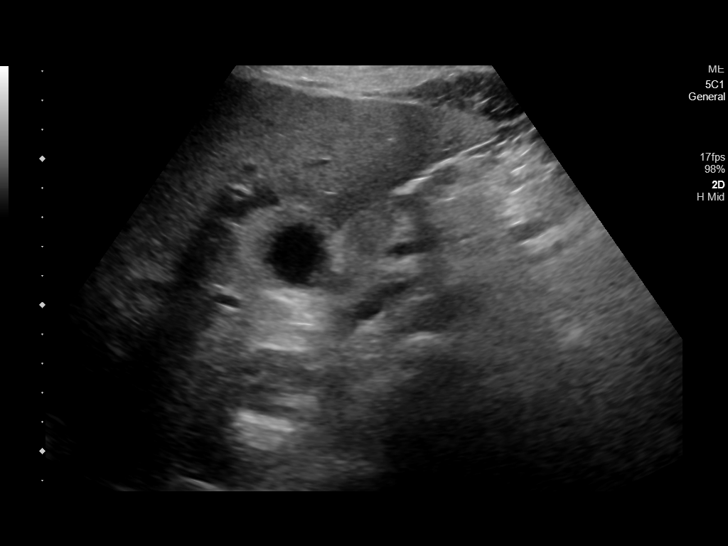
[im 22/48]
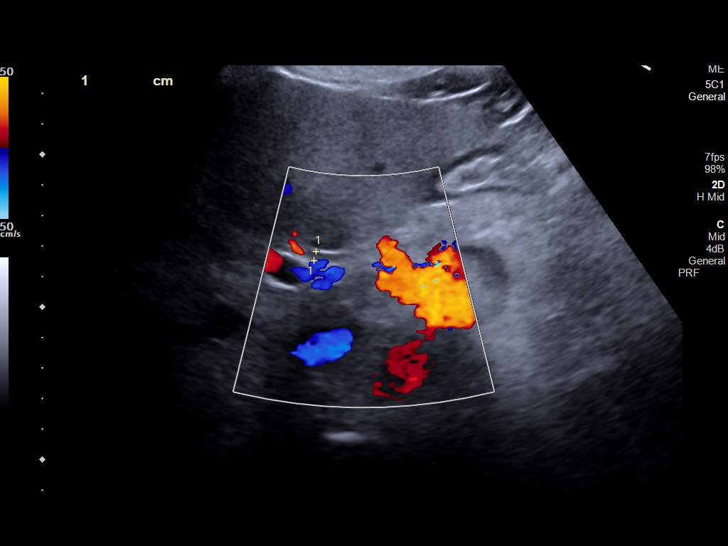
[im 26/48]
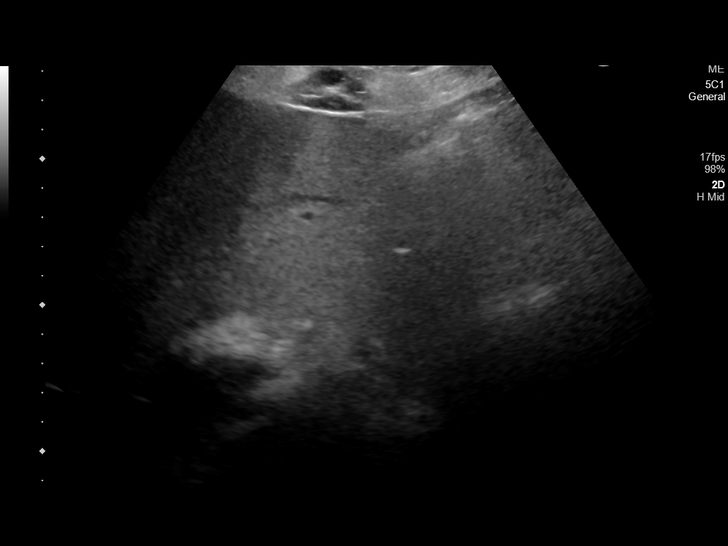
[im 30/48]
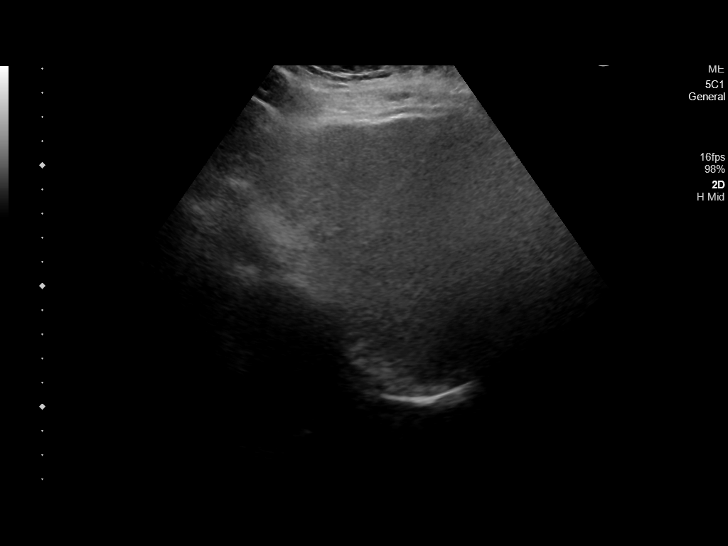
[im 32/48]
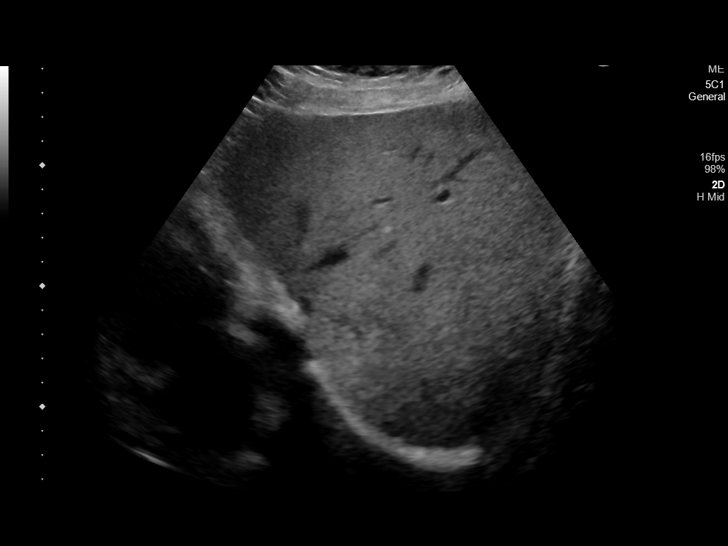
[im 36/48]
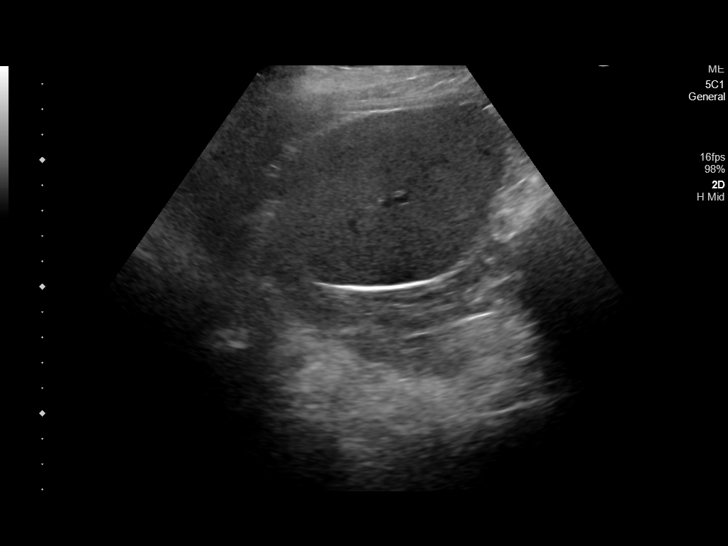
[im 40/48]
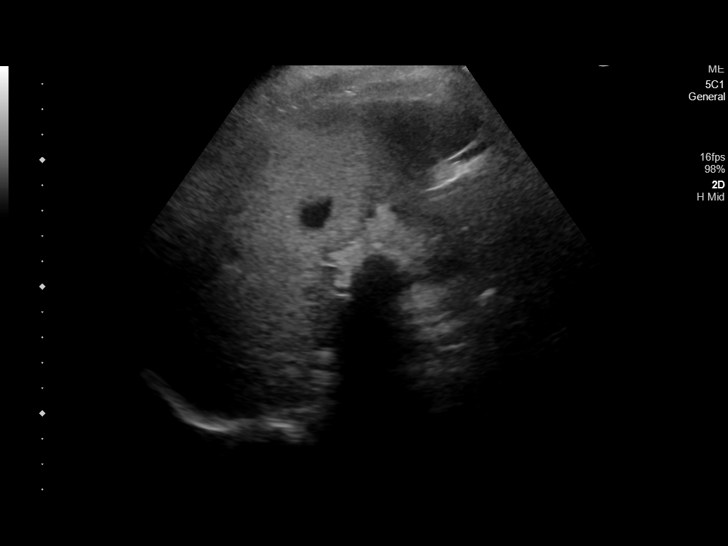
[im 44/48]
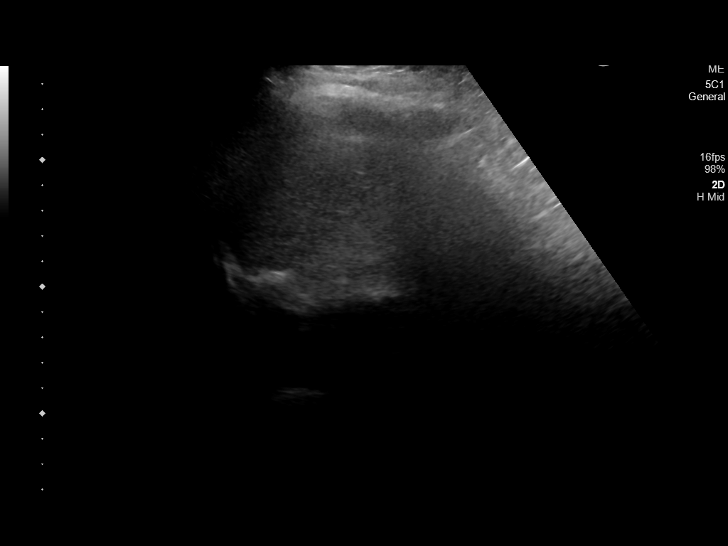
[im 48/48]
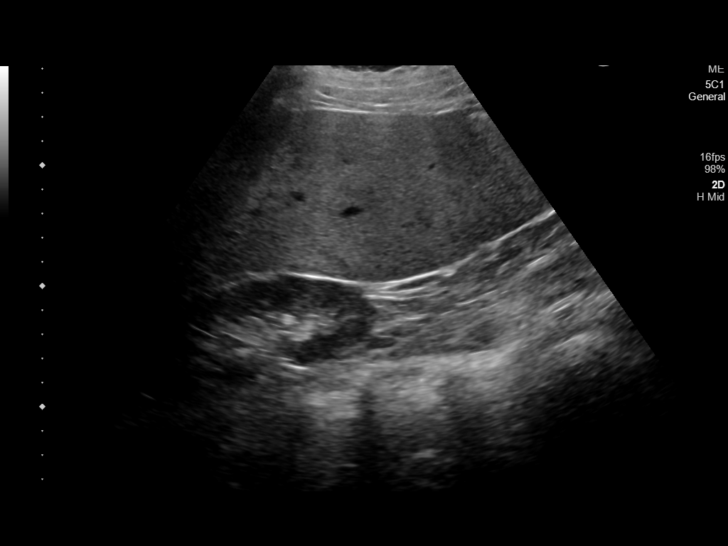

[14 of 25 positions shown; findings below may reference images not displayed]

FINDINGS: Gallbladder:

2.5 cm gallstone towards the fundus. 1.8 cm fixed shadowing stone at
the gallbladder neck. Gallbladder wall thickening to 5 mm with
possible pericholecystic edema that is mild. Focal tenderness per
sonographer exam

Common bile duct:

Diameter: 3 mm

Liver:

Echogenic liver. Portal vein is patent on color Doppler imaging with
normal direction of blood flow towards the liver.

Other: None.
IMPRESSION: 1. Cholelithiasis, gallbladder wall thickening, and focal tenderness
suggesting acute cholecystitis.
2. Hepatic steatosis.

## 2022-12-06 ENCOUNTER — Emergency Department
Admission: EM | Admit: 2022-12-06 | Discharge: 2022-12-06 | Disposition: A | Discharge status: Home / Self Care | Payer: Self-pay | Attending: Emergency Medicine | Admitting: Emergency Medicine

## 2022-12-06 ENCOUNTER — Other Ambulatory Visit: Payer: Self-pay

## 2022-12-06 DIAGNOSIS — I10 Essential (primary) hypertension: Secondary | ICD-10-CM | POA: Insufficient documentation

## 2022-12-06 LAB — URINALYSIS, ROUTINE W REFLEX MICROSCOPIC
Bilirubin Urine: NEGATIVE
Glucose, UA: NEGATIVE mg/dL
Ketones, ur: NEGATIVE mg/dL
Leukocytes,Ua: NEGATIVE
Nitrite: NEGATIVE
Protein, ur: NEGATIVE mg/dL
Specific Gravity, Urine: 1.017 (ref 1.005–1.030)
pH: 7 (ref 5.0–8.0)

## 2022-12-06 LAB — CBC
HCT: 37.7 % (ref 36.0–46.0)
Hemoglobin: 12.6 g/dL (ref 12.0–15.0)
MCH: 29 pg (ref 26.0–34.0)
MCHC: 33.4 g/dL (ref 30.0–36.0)
MCV: 86.7 fL (ref 80.0–100.0)
Platelets: 264 10*3/uL (ref 150–400)
RBC: 4.35 MIL/uL (ref 3.87–5.11)
RDW: 12.4 % (ref 11.5–15.5)
WBC: 9 10*3/uL (ref 4.0–10.5)
nRBC: 0 % (ref 0.0–0.2)

## 2022-12-06 LAB — BASIC METABOLIC PANEL
Anion gap: 7 (ref 5–15)
BUN: 11 mg/dL (ref 6–20)
CO2: 26 mmol/L (ref 22–32)
Calcium: 8.8 mg/dL — ABNORMAL LOW (ref 8.9–10.3)
Chloride: 102 mmol/L (ref 98–111)
Creatinine, Ser: 0.38 mg/dL — ABNORMAL LOW (ref 0.44–1.00)
GFR, Estimated: >60 mL/min (ref >60)
Glucose, Bld: 90 mg/dL (ref 70–99)
Potassium: 3.4 mmol/L — ABNORMAL LOW (ref 3.5–5.1)
Sodium: 135 mmol/L (ref 135–145)

## 2022-12-06 MED ORDER — LORAZEPAM 1 MG PO TABS
1.0000 mg | ORAL_TABLET | Freq: Once | ORAL | Status: AC
  Administered 2022-12-06: 1 mg via ORAL
  Filled 2022-12-06: qty 1

## 2022-12-06 MED ORDER — ACETAMINOPHEN 500 MG PO TABS
1000.0000 mg | ORAL_TABLET | Freq: Once | ORAL | Status: AC
  Administered 2022-12-06: 1000 mg via ORAL
  Filled 2022-12-06: qty 2

## 2022-12-06 MED ORDER — HYDROCHLOROTHIAZIDE 25 MG PO TABS: 25.0000 mg | ORAL_TABLET | Freq: Every day | ORAL | 5 refills | Status: DC

## 2022-12-06 NOTE — Discharge Instructions (Addendum)
As we discussed please begin taking your blood pressure medication each morning.  Please periodically check your blood pressure either at home or at a pharmacy.  If your blood pressure ever drops below 120 on the top number please discontinue use of your blood pressure medication.  Please call the number provided to arrange a primary care appointment as soon as you are able.  Return to the emergency department for any symptom personally concerning to yourself.

## 2022-12-06 NOTE — ED Triage Notes (Signed)
Pt comes with c/o HTN. Pt states some dizziness. Pt denies any Bp meds. UC reports BP in 200s with them.

## 2022-12-06 NOTE — ED Provider Notes (Signed)
[facility] Provider Note    Event Date/Time   First MD Initiated Contact with Patient 12/06/22 1348     (approximate)  History   Chief Complaint: Hypertension  HPI  Jessica Bright is a 47 y.o. female with a past medical history of hypertension, gastric reflux, presents to the emergency department for elevated blood pressure.  According to the patient for the past week or 2 she has been experiencing intermittent headache.  She went to urgent care today and had a blood pressure greater than 200 and was referred to the emergency department.  Upon arrival patient has a blood pressure of 209/116.  Patient states she does not take any blood pressure medication at baseline but also does not follow-up with a PCP.  States when she had a child 10 years ago she was told after the fact that her blood pressure was mildly elevated.  Patient denies any chest pain no shortness of breath.  Physical Exam   Triage Vital Signs: ED Triage Vitals  Enc Vitals Group     BP 12/06/22 1225 (!) 209/116     Pulse Rate 12/06/22 1225 82     Resp 12/06/22 1225 18     Temp 12/06/22 1225 98 F (36.7 C)     Temp Source 12/06/22 1225 Oral     SpO2 12/06/22 1225 98 %     Weight 12/06/22 1226 174 lb (78.9 kg)     Height 12/06/22 1226 4' 7"$  (1.397 m)     Head Circumference --      Peak Flow --      Pain Score 12/06/22 1224 2     Pain Loc --      Pain Edu? --      Excl. in [place]? --     Most recent vital signs: Vitals:   12/06/22 1400 12/06/22 1430  BP: (!) 174/98 (!) 155/82  Pulse: 74 74  Resp: 14 20  Temp:    SpO2: 100% 100%    General: Awake, no distress.  CV:  Good peripheral perfusion.  Regular rate and rhythm  Resp:  Normal effort.  Equal breath sounds bilaterally.  Abd:  No distention.  Soft, nontender.  No rebound or guarding.   ED Results / Procedures / Treatments   EKG  EKG viewed and interpreted by myself shows a normal sinus rhythm 82 bpm with a narrow QRS, normal  axis, normal intervals, no concerning ST changes.  MEDICATIONS ORDERED IN ED: Medications  LORazepam (ATIVAN) tablet 1 mg (1 mg Oral Given 12/06/22 1456)  acetaminophen (TYLENOL) tablet 1,000 mg (1,000 mg Oral Given 12/06/22 1456)     IMPRESSION / MDM / ASSESSMENT AND PLAN / ED COURSE  I reviewed the triage vital signs and the nursing notes.  Patient's presentation is most consistent with acute presentation with potential threat to life or bodily function.  Patient presents emergency department for hypertension.  Overall the patient appears well states mild headache currently.  Reassuring physical exam.  Patient's lab work is overall reassuring as well normal renal function on her chemistry, normal CBC, reassuring/normal urinalysis as well.  EKG shows no concerning findings.  Patient was given Tylenol for her headache and 1 mg of Ativan.  Blood pressure has come down nicely currently 155/82.  I discussed with the patient given her elevated blood pressure trial of hydrochlorothiazide 25 mg daily have the patient follow-up with a primary care doctor but also checking her blood pressure either at home or  at a pharmacy every 2 or 3 days for the next couple weeks to ensure that it does not drop too low if that occurs the patient is to stop this medication.  Patient is agreeable to this plan of care.  FINAL CLINICAL IMPRESSION(S) / ED DIAGNOSES   Hypertension  Rx / DC Orders   Hydrochlorothiazide  Note:  This document was prepared using Dragon voice recognition software and may include unintentional dictation errors.   Harvest Dark, MD 12/06/22 1524

## 2024-03-09 ENCOUNTER — Other Ambulatory Visit
Admission: RE | Admit: 2024-03-09 | Discharge: 2024-03-09 | Disposition: A | Discharge status: Home / Self Care | Payer: Self-pay | Source: Ambulatory Visit | Attending: Family Medicine | Admitting: Family Medicine

## 2024-03-09 ENCOUNTER — Encounter: Payer: Self-pay | Admitting: Family Medicine

## 2024-03-09 ENCOUNTER — Ambulatory Visit: Payer: Self-pay | Admitting: Family Medicine

## 2024-03-09 VITALS — BP 126/70 | HR 76 | Resp 16 | Ht <= 58 in | Wt 186.0 lb

## 2024-03-09 DIAGNOSIS — M542 Cervicalgia: Secondary | ICD-10-CM

## 2024-03-09 DIAGNOSIS — Z114 Encounter for screening for human immunodeficiency virus [HIV]: Secondary | ICD-10-CM

## 2024-03-09 DIAGNOSIS — R351 Nocturia: Secondary | ICD-10-CM | POA: Insufficient documentation

## 2024-03-09 DIAGNOSIS — E66813 Obesity, class 3: Secondary | ICD-10-CM | POA: Insufficient documentation

## 2024-03-09 DIAGNOSIS — I10 Essential (primary) hypertension: Secondary | ICD-10-CM | POA: Insufficient documentation

## 2024-03-09 DIAGNOSIS — Z1159 Encounter for screening for other viral diseases: Secondary | ICD-10-CM

## 2024-03-09 DIAGNOSIS — M549 Dorsalgia, unspecified: Secondary | ICD-10-CM

## 2024-03-09 DIAGNOSIS — Z5971 Insufficient health insurance coverage: Secondary | ICD-10-CM

## 2024-03-09 DIAGNOSIS — Z6841 Body Mass Index (BMI) 40.0 and over, adult: Secondary | ICD-10-CM

## 2024-03-09 DIAGNOSIS — R519 Headache, unspecified: Secondary | ICD-10-CM

## 2024-03-09 DIAGNOSIS — Z113 Encounter for screening for infections with a predominantly sexual mode of transmission: Secondary | ICD-10-CM

## 2024-03-09 DIAGNOSIS — Z603 Acculturation difficulty: Secondary | ICD-10-CM

## 2024-03-09 DIAGNOSIS — E669 Obesity, unspecified: Secondary | ICD-10-CM | POA: Insufficient documentation

## 2024-03-09 DIAGNOSIS — Z0000 Encounter for general adult medical examination without abnormal findings: Secondary | ICD-10-CM

## 2024-03-09 DIAGNOSIS — Z0001 Encounter for general adult medical examination with abnormal findings: Secondary | ICD-10-CM

## 2024-03-09 DIAGNOSIS — K219 Gastro-esophageal reflux disease without esophagitis: Secondary | ICD-10-CM

## 2024-03-09 DIAGNOSIS — H538 Other visual disturbances: Secondary | ICD-10-CM

## 2024-03-09 DIAGNOSIS — Z833 Family history of diabetes mellitus: Secondary | ICD-10-CM | POA: Insufficient documentation

## 2024-03-09 LAB — CBC WITH DIFFERENTIAL/PLATELET
Abs Immature Granulocytes: 0.04 10*3/uL (ref 0.00–0.07)
Basophils Absolute: 0 10*3/uL (ref 0.0–0.1)
Basophils Relative: 0 %
Eosinophils Absolute: 0.3 10*3/uL (ref 0.0–0.5)
Eosinophils Relative: 4 %
HCT: 36.9 % (ref 36.0–46.0)
Hemoglobin: 12.8 g/dL (ref 12.0–15.0)
Immature Granulocytes: 1 %
Lymphocytes Relative: 32 %
Lymphs Abs: 2.4 10*3/uL (ref 0.7–4.0)
MCH: 30.7 pg (ref 26.0–34.0)
MCHC: 34.7 g/dL (ref 30.0–36.0)
MCV: 88.5 fL (ref 80.0–100.0)
Monocytes Absolute: 0.5 10*3/uL (ref 0.1–1.0)
Monocytes Relative: 6 %
Neutro Abs: 4.2 10*3/uL (ref 1.7–7.7)
Neutrophils Relative %: 57 %
Platelets: 305 10*3/uL (ref 150–400)
RBC: 4.17 MIL/uL (ref 3.87–5.11)
RDW: 12.5 % (ref 11.5–15.5)
WBC: 7.4 10*3/uL (ref 4.0–10.5)
nRBC: 0 % (ref 0.0–0.2)

## 2024-03-09 LAB — URINALYSIS, ROUTINE W REFLEX MICROSCOPIC
Bilirubin Urine: NEGATIVE
Glucose, UA: NEGATIVE mg/dL
Hgb urine dipstick: NEGATIVE
Ketones, ur: NEGATIVE mg/dL
Leukocytes,Ua: NEGATIVE
Nitrite: NEGATIVE
Protein, ur: 30 mg/dL — AB
Specific Gravity, Urine: 1.025 (ref 1.005–1.030)
pH: 6 (ref 5.0–8.0)

## 2024-03-09 LAB — COMPREHENSIVE METABOLIC PANEL WITH GFR
ALT: 172 U/L — ABNORMAL HIGH (ref 0–44)
AST: 160 U/L — ABNORMAL HIGH (ref 15–41)
Albumin: 4.1 g/dL (ref 3.5–5.0)
Alkaline Phosphatase: 92 U/L (ref 38–126)
Anion gap: 9 (ref 5–15)
BUN: 11 mg/dL (ref 6–20)
CO2: 28 mmol/L (ref 22–32)
Calcium: 8.7 mg/dL — ABNORMAL LOW (ref 8.9–10.3)
Chloride: 100 mmol/L (ref 98–111)
Creatinine, Ser: 0.53 mg/dL (ref 0.44–1.00)
GFR, Estimated: >60 mL/min (ref >60)
Glucose, Bld: 122 mg/dL — ABNORMAL HIGH (ref 70–99)
Potassium: 3.3 mmol/L — ABNORMAL LOW (ref 3.5–5.1)
Sodium: 137 mmol/L (ref 135–145)
Total Bilirubin: 1.2 mg/dL (ref 0.0–1.2)
Total Protein: 7.8 g/dL (ref 6.5–8.1)

## 2024-03-09 LAB — HEMOGLOBIN A1C
Hgb A1c MFr Bld: 6.8 % — ABNORMAL HIGH (ref 4.8–5.6)
Mean Plasma Glucose: 148.46 mg/dL

## 2024-03-09 MED ORDER — BACLOFEN 5 MG PO TABS: 5.0000 mg | ORAL_TABLET | Freq: Three times a day (TID) | ORAL | 1 refills | Status: DC | PRN

## 2024-03-09 MED ORDER — PANTOPRAZOLE SODIUM 40 MG PO TBEC: 40.0000 mg | DELAYED_RELEASE_TABLET | Freq: Every day | ORAL | 3 refills | Status: DC

## 2024-03-09 NOTE — Progress Notes (Signed)
 Name: Jessica Bright   MRN: [id number]    DOB: 01-Apr-1976   Date:03/09/2024       Progress Note  Chief Complaint  Patient presents with   Establish Care     Subjective:   Jessica Bright is a 48 y.o. female, presents to clinic to establish care Pt alone and we are using video/virtual interpreter in spanish  She has no insurance, no recent or past medical care or hx per pt today except acute ED visits She lives with her children Personal and family hx reviewed and updated   She has concerns with blurry vision, HA's, nocturia Unknown last vision screening, blurry vision onset 7 months No increased urinary frequency during the day, no weight changes/weight loss Some increased thirst, no dysuria, some flank discomfort, no suprapubic pain or hermaturia Some stomach bloating  HA's occurring for 3 years unilateral right sided posterior HA's, pain is described as pressure, sometimes eyes hurt when having HA Sound can make HA's worse No worsening with sound or smells In the last week 3/7 days had HA Neck and upper shoulder muscle tension does seem related to HA pains  She has treated with OTC tylenol  and it doesn't help, sometimes HA there upon waking No known hx of snoring or OSA No personal and family hx of migraines   Hx of HTN on chart, not on meds for more than the last month HCTZ from ED provider over a year ago BP Readings from Last 3 Encounters:  03/09/24 126/70  12/06/22 (!) 166/106  12/22/20 137/73   GERD meds also from a while ago - not taking (years ago)       Current Outpatient Medications:    hydrochlorothiazide  (HYDRODIURIL ) 25 MG tablet, Take 1 tablet (25 mg total) by mouth daily. (Patient not taking: Reported on 03/09/2024), Disp: 30 tablet, Rfl: 5   omeprazole (PRILOSEC) 20 MG capsule, Take 20 mg by mouth daily. (Patient not taking: Reported on 03/09/2024), Disp: , Rfl:   Patient Active Problem List   Diagnosis Date Noted   Acute cholecystitis 12/21/2020     Past Surgical History:  Procedure Laterality Date   CHOLECYSTECTOMY     Around 2022    Family History  Problem Relation Age of Onset   Diabetes Mother    Diabetes Father    Diabetes Sister     Social History   Tobacco Use   Smoking status: Never   Smokeless tobacco: Never  Substance Use Topics   Alcohol use: Yes   Drug use: Never     No Known Allergies  Health Maintenance  Topic Date Due   HIV Screening  Never done   Hepatitis C Screening  Never done   Cervical Cancer Screening (HPV/Pap Cotest)  Never done   DTaP/Tdap/Td (3 - Td or Tdap) 11/25/2022   Colonoscopy  Never done   COVID-19 Vaccine (2 - 2024-25 season) 06/22/2023   INFLUENZA VACCINE  05/21/2024   HPV VACCINES  Aged Out   Meningococcal B Vaccine  Aged Out   SDOH Screenings   Food Insecurity: No Food Insecurity (03/09/2024)  Housing: Unknown (03/09/2024)  Transportation Needs: No Transportation Needs (03/09/2024)  Utilities: Not At Risk (03/09/2024)  Alcohol Screen: Low Risk  (03/09/2024)  Depression (PHQ2-9): Low Risk  (03/09/2024)  Financial Resource Strain: Low Risk  (03/09/2024)  Physical Activity: Inactive (03/09/2024)  Social Connections: Moderately Isolated (03/09/2024)  Stress: No Stress Concern Present (03/09/2024)  Tobacco Use: Low Risk  (03/09/2024)  Health Literacy: Adequate Health Literacy (  03/09/2024)     Chart Review Today: I personally reviewed active problem list, medication list, allergies, family history, social history, health maintenance, notes from last encounter, lab results, imaging with the patient/caregiver today.   Review of Systems  Constitutional: Negative.   HENT: Negative.    Eyes: Negative.   Respiratory: Negative.    Cardiovascular: Negative.   Gastrointestinal: Negative.   Endocrine: Negative.   Genitourinary: Negative.   Musculoskeletal: Negative.   Skin: Negative.   Allergic/Immunologic: Negative.   Neurological: Negative.   Hematological: Negative.    Psychiatric/Behavioral: Negative.    All other systems reviewed and are negative.    Objective:   Vitals:   03/09/24 1031  BP: 126/70  Pulse: 76  Resp: 16  SpO2: 97%  Weight: 186 lb (84.4 kg)  Height: 4\' 7"  (1.397 m)    Body mass index is 43.23 kg/m.  Physical Exam Vitals and nursing note reviewed.  Constitutional:      General: She is not in acute distress.    Appearance: She is well-developed. She is obese. She is not ill-appearing, toxic-appearing or diaphoretic.  HENT:     Head: Normocephalic and atraumatic.     Right Ear: External ear normal.     Left Ear: External ear normal.     Nose: Nose normal.     Mouth/Throat:     Mouth: Mucous membranes are moist.     Pharynx: Oropharynx is clear.  Eyes:     General: No scleral icterus.       Right eye: No discharge.        Left eye: No discharge.     Extraocular Movements: Extraocular movements intact.     Conjunctiva/sclera: Conjunctivae normal.     Pupils: Pupils are equal, round, and reactive to light.  Neck:     Trachea: No tracheal deviation.  Cardiovascular:     Rate and Rhythm: Normal rate and regular rhythm.     Pulses: Normal pulses.     Heart sounds: Normal heart sounds. No murmur heard.    No friction rub. No gallop.  Pulmonary:     Effort: Pulmonary effort is normal. No respiratory distress.     Breath sounds: Normal breath sounds. No stridor. No wheezing, rhonchi or rales.  Abdominal:     General: Bowel sounds are normal.  Musculoskeletal:     Cervical back: Normal range of motion. Tenderness (upper shoulder and cervical paraspinal muscle tension and ttp, no midline ttp) present. No rigidity.     Right lower leg: No edema.     Left lower leg: No edema.  Lymphadenopathy:     Cervical: No cervical adenopathy.  Skin:    General: Skin is warm and dry.     Coloration: Skin is not jaundiced or pale.     Findings: No rash.  Neurological:     Mental Status: She is alert.     Motor: No abnormal muscle  tone.     Coordination: Coordination normal.  Psychiatric:        Mood and Affect: Mood normal.        Behavior: Behavior normal.      Functional Status Survey: Is the patient deaf or have difficulty hearing?: Yes Does the patient have difficulty seeing, even when wearing glasses/contacts?: No Does the patient have difficulty concentrating, remembering, or making decisions?: No Does the patient have difficulty walking or climbing stairs?: No Does the patient have difficulty dressing or bathing?: No Does the patient have difficulty doing  errands alone such as visiting a doctor's office or shopping?: No Results for orders placed or performed during the hospital encounter of 12/06/22  Basic metabolic panel   Collection Time: 12/06/22 12:25 PM  Result Value Ref Range   Sodium 135 135 - 145 mmol/L   Potassium 3.4 (L) 3.5 - 5.1 mmol/L   Chloride 102 98 - 111 mmol/L   CO2 26 22 - 32 mmol/L   Glucose, Bld 90 70 - 99 mg/dL   BUN 11 6 - 20 mg/dL   Creatinine, Ser 7.84 (L) 0.44 - 1.00 mg/dL   Calcium 8.8 (L) 8.9 - 10.3 mg/dL   GFR, Estimated >69 >62 mL/min   Anion gap 7 5 - 15  CBC   Collection Time: 12/06/22 12:25 PM  Result Value Ref Range   WBC 9.0 4.0 - 10.5 K/uL   RBC 4.35 3.87 - 5.11 MIL/uL   Hemoglobin 12.6 12.0 - 15.0 g/dL   HCT 95.2 84.1 - 32.4 %   MCV 86.7 80.0 - 100.0 fL   MCH 29.0 26.0 - 34.0 pg   MCHC 33.4 30.0 - 36.0 g/dL   RDW 40.1 02.7 - 25.3 %   Platelets 264 150 - 400 K/uL   nRBC 0.0 0.0 - 0.2 %  Urinalysis, Routine w reflex microscopic -Urine, Random   Collection Time: 12/06/22 12:25 PM  Result Value Ref Range   Color, Urine YELLOW (A) YELLOW   APPearance CLEAR (A) CLEAR   Specific Gravity, Urine 1.017 1.005 - 1.030   pH 7.0 5.0 - 8.0   Glucose, UA NEGATIVE NEGATIVE mg/dL   Hgb urine dipstick SMALL (A) NEGATIVE   Bilirubin Urine NEGATIVE NEGATIVE   Ketones, ur NEGATIVE NEGATIVE mg/dL   Protein, ur NEGATIVE NEGATIVE mg/dL   Nitrite NEGATIVE NEGATIVE    Leukocytes,Ua NEGATIVE NEGATIVE   RBC / HPF 0-5 0 - 5 RBC/hpf   WBC, UA 0-5 0 - 5 WBC/hpf   Bacteria, UA RARE (A) NONE SEEN   Squamous Epithelial / HPF 0-5 0 - 5 /HPF   Mucus PRESENT       Assessment & Plan:     ICD-10-CM   1. Encounter for medical examination to establish care  Z00.00    new to est care, no prior PCP or routine health care,. reviewed past ED/UC visits and labs    2. Screen for STD (sexually transmitted disease)  Z11.3    due for screening, sexually active uses condoms, no known or suspected exposure, cash pay pt, she declines HIV STD testing today    3. Need for hepatitis C screening test  Z11.59    due - will defer due to cost/lack of insurance    4. Primary hypertension  I10 Comprehensive Metabolic Panel (CMET)   not currently on meds, BP acceptable and below 130/80, will not rx the HCTZ, the prescription was from over a year ago    5. Family history of diabetes mellitus  Z83.3 Hemoglobin A1c   parents and sister    57. Gastroesophageal reflux disease, unspecified whether esophagitis present  K21.9 pantoprazole  (PROTONIX ) 40 MG tablet   some bloating and GI sx over night and first thing in the morning, PPI tx trial again for 2-4 weeks    7. Class 3 severe obesity with body mass index (BMI) of 40.0 to 44.9 in adult, unspecified obesity type, unspecified whether serious comorbidity present  E66.813 CBC with Differential/Platelet   Z68.41 Comprehensive Metabolic Panel (CMET)    Hemoglobin A1c  8. Screening for HIV without presence of risk factors  Z11.4    see #2    9. Nocturia  R35.1 Microalbumin / creatinine urine ratio    Urine Culture    Urinalysis, Routine w reflex microscopic   onset 7 months ago, screen for DM, infections    10. Blurry vision  H53.8    onset 3 years ago, no past eye exam, r/o hyperglycemia/DM, refer to optometry for eval, PERRLA today, no acute sx or changes    11. Does not have health insurance  Z59.71 AMB Referral [facility] Care  Management   discussed labs at [facility] with [facility] pay discounts, looked up meds with [facility], will need to connect pt to resources at [facility] health/charity care    12. Language barrier affecting health care  Z60.3 AMB Referral [facility] Care Management   Z75.8    will require longer appt times and translator/interpreter services to give equitable health care to pt - have asked staff to arrange for future appts    13. Neck pain  M54.2 Baclofen 5 MG TABS   heat tx, gentle stretching, massage, baclofen trial    14. Upper back pain  M54.9 Baclofen 5 MG TABS   tension to upper back/shoulders may be related to HA's, plan see above    15. Nonintractable episodic headache, unspecified headache type  R51.9    3x a week, some neck/back tension, discussed various causes/DDx, asked her to keep HA journal, trial of muscle relaxers, excedrine/NSAIDs    Con't HA - some of her HA sx are somewhat migraine like - unilateral, worse with sound, intermittent, may be r/t neck/back pain, vision? Did consider migraine meds like imitrex, but she started to tell me more about her muscle tension and the quality of pressure to describe pain - so instead we tried tx targeted at MSK cervicalgia/tension headache type treatments Of course diabetes hyperglycemia and dehydration could also cause headaches so could sleep apnea -  I explained to her that blood work and trial of medications and gathering more history about her headaches will help us  hopefully get to a diagnosis and more focused treatment      Return for 2-4 week follow up GI symptoms/ labs / f/up HA's.   Adeline Hone, PA-C 03/09/24 10:57 AM

## 2024-03-09 NOTE — Patient Instructions (Addendum)
 Go to the [facility] main medical mall and go to registration and then ask them to send you to the outpatient lab  For head aches please keep notes about anything you can think of that happened before and when you have headaches.  You can try over the counter Excedrine migraine and can also try ibuprofen 400 mg up to three times a day Work on muscle tension in your shoulders and back with heat therapy, gentle stretches, massage and you can try the muscle relaxer - it will make you tired, do not use when driving or going to work/job.    Dirjase al centro mdico principal de [facility], vaya a la recepcin y [employer] solicite que lo remitan al laboratorio de consultas externas.  Para dolores de [place], anote cualquier cosa que recuerde que haya ocurrido antes y cundo tuvo dolores de cabeza. Puede probar Excedrine para la migraa (de venta libre) y tambin ibuprofeno 400 mg hasta tres veces al da. Trabaje para la tensin muscular en hombros y espalda con terapia de calor, estiramientos suaves, masajes y puede probar un relajante muscular; le causar cansancio, no lo use mientras conduce ni va al Danne Dustman.  [facility]  [address] [place], [place] [address]  [phone]

## 2024-03-10 LAB — URINE CULTURE: Culture: NO GROWTH

## 2024-03-10 LAB — MICROALBUMIN / CREATININE URINE RATIO
Creatinine, Urine: 201.1 mg/dL
Microalb Creat Ratio: 16 mg/g{creat} (ref 0–29)
Microalb, Ur: 32.7 ug/mL — ABNORMAL HIGH

## 2024-03-11 ENCOUNTER — Ambulatory Visit: Payer: Self-pay | Admitting: Family Medicine

## 2024-03-11 DIAGNOSIS — E119 Type 2 diabetes mellitus without complications: Secondary | ICD-10-CM | POA: Insufficient documentation

## 2024-03-11 DIAGNOSIS — R1010 Upper abdominal pain, unspecified: Secondary | ICD-10-CM

## 2024-03-11 DIAGNOSIS — R7989 Other specified abnormal findings of blood chemistry: Secondary | ICD-10-CM

## 2024-03-22 ENCOUNTER — Telehealth: Payer: Self-pay

## 2024-03-22 NOTE — Progress Notes (Signed)
 Complex Care Management Note Care Guide Note  03/22/2024 Name: Jessica Bright MRN: [id number] DOB: 08-29-1976   Complex Care Management Outreach Attempts: An unsuccessful telephone outreach was attempted today to offer the patient information about available complex care management services.  Follow Up Plan:  Additional outreach attempts will be made to offer the patient complex care management information and services.   Encounter Outcome:  No Answer  Lenton Rail , RMA     [place]  [facility], [facility] Guide  Direct Dial: [phone]  Website: [email or web address]

## 2024-03-26 NOTE — Progress Notes (Signed)
 Complex Care Management Note Care Guide Note  03/26/2024 Name: Jessica Bright MRN: [id number] DOB: 10-14-1976   Complex Care Management Outreach Attempts: A second unsuccessful outreach was attempted today to offer the patient with information about available complex care management services.  Follow Up Plan:  Additional outreach attempts will be made to offer the patient complex care management information and services.   Encounter Outcome:  No Answer  Lenton Rail , RMA       [facility], [facility] Guide  Direct Dial: [phone]  Website: [email or web address]

## 2024-03-30 NOTE — Progress Notes (Signed)
 Complex Care Management Note Care Guide Note  03/30/2024 Name: Jessica Bright MRN: [id number] DOB: [birth year]   Complex Care Management Outreach Attempts: A third unsuccessful outreach was attempted today to offer the patient with information about available complex care management services.  Follow Up Plan:  No further outreach attempts will be made at this time. We have been unable to contact the patient to offer or enroll patient in complex care management services.  Encounter Outcome:  No Answer  Lenton Rail , RMA     [place]  [facility], [facility] Guide  Direct Dial: [phone]  Website: [email or web address]

## 2024-04-02 ENCOUNTER — Other Ambulatory Visit: Payer: Self-pay

## 2024-04-02 ENCOUNTER — Telehealth: Payer: Self-pay

## 2024-04-02 NOTE — Progress Notes (Signed)
   04/02/2024  Patient ID: Jessica Bright, female   DOB: 18-Nov-1975, 48 y.o.   MRN: [id number]  Attempted to contact patient for medication management/review. Unable to leave HIPAA compliant message for patient to return my call at their convenience, per interpreter.   First attempt for patient outreach. Will follow up when patient reschedules appointment.   Thank you for allowing pharmacy to be a part of this patient's care.  Alexandria Angel, PharmD Clinical Pharmacist Cell: [phone]

## 2024-04-08 ENCOUNTER — Encounter: Payer: Self-pay | Admitting: Family Medicine

## 2024-04-08 ENCOUNTER — Other Ambulatory Visit
Admission: RE | Admit: 2024-04-08 | Discharge: 2024-04-08 | Disposition: A | Discharge status: Home / Self Care | Payer: Self-pay | Source: Ambulatory Visit | Attending: Family Medicine | Admitting: Family Medicine

## 2024-04-08 ENCOUNTER — Ambulatory Visit (INDEPENDENT_AMBULATORY_CARE_PROVIDER_SITE_OTHER): Payer: Self-pay | Admitting: Family Medicine

## 2024-04-08 VITALS — BP 128/70 | HR 75 | Resp 16 | Ht <= 58 in | Wt 185.0 lb

## 2024-04-08 DIAGNOSIS — R1010 Upper abdominal pain, unspecified: Secondary | ICD-10-CM

## 2024-04-08 DIAGNOSIS — Z5971 Insufficient health insurance coverage: Secondary | ICD-10-CM

## 2024-04-08 DIAGNOSIS — E119 Type 2 diabetes mellitus without complications: Secondary | ICD-10-CM | POA: Insufficient documentation

## 2024-04-08 DIAGNOSIS — Z603 Acculturation difficulty: Secondary | ICD-10-CM

## 2024-04-08 DIAGNOSIS — R7989 Other specified abnormal findings of blood chemistry: Secondary | ICD-10-CM | POA: Insufficient documentation

## 2024-04-08 DIAGNOSIS — Z758 Other problems related to medical facilities and other health care: Secondary | ICD-10-CM

## 2024-04-08 LAB — COMPREHENSIVE METABOLIC PANEL WITH GFR
ALT: 157 U/L — ABNORMAL HIGH (ref 0–44)
AST: 191 U/L — ABNORMAL HIGH (ref 15–41)
Albumin: 4.3 g/dL (ref 3.5–5.0)
Alkaline Phosphatase: 94 U/L (ref 38–126)
Anion gap: 12 (ref 5–15)
BUN: 9 mg/dL (ref 6–20)
CO2: 25 mmol/L (ref 22–32)
Calcium: 8.9 mg/dL (ref 8.9–10.3)
Chloride: 101 mmol/L (ref 98–111)
Creatinine, Ser: 0.46 mg/dL (ref 0.44–1.00)
GFR, Estimated: >60 mL/min (ref >60)
Glucose, Bld: 124 mg/dL — ABNORMAL HIGH (ref 70–99)
Potassium: 3.5 mmol/L (ref 3.5–5.1)
Sodium: 138 mmol/L (ref 135–145)
Total Bilirubin: 1.1 mg/dL (ref 0.0–1.2)
Total Protein: 8.4 g/dL — ABNORMAL HIGH (ref 6.5–8.1)

## 2024-04-08 MED ORDER — METFORMIN HCL 500 MG PO TABS: 500.0000 mg | ORAL_TABLET | Freq: Every day | ORAL | 0 refills | Status: DC

## 2024-04-08 NOTE — Progress Notes (Signed)
 Patient ID: Illiana Losurdo, female    DOB: 02-11-1976, 48 y.o.   MRN: [id number]  PCP: Leavy Mole, PA-C  Chief Complaint  Patient presents with   Follow-up    Review abnormal labs   Shoulder Pain    L shoulder bloating, x1 week   Visit done with spanish interpreter in person Subjective:   Mylisa Brunson is a 48 y.o. female, presents to clinic with CC of the following:  HPI  F//up on abnormal labs and abd pain/bloating and urinary sx Pt did not get all the info from her abnormal labs, she did get voicemail to call but but couldn't find what number or extension to call back So she was unaware of labs   GI sx better with medications LFTs high reviewed with her, she has poor diet, no tylenol  or supplements, no known recent infections or bowel changes, rare ETOH  New onset DM Reviewed her labs Lab Results  Component Value Date   HGBA1C 6.8 (H) 03/09/2024  She is willing to take metformin       Patient Active Problem List   Diagnosis Date Noted   New onset type 2 diabetes mellitus (HCC) 03/11/2024   Obesity, unspecified 03/09/2024   Gastroesophageal reflux disease 03/09/2024   Primary hypertension 03/09/2024      Current Outpatient Medications:    pantoprazole  (PROTONIX ) 40 MG tablet, Take 1 tablet (40 mg total) by mouth daily., Disp: 30 tablet, Rfl: 3   Baclofen  5 MG TABS, Take 1 tablet (5 mg total) by mouth 3 (three) times daily as needed (msk pain or spasms). (Patient not taking: Reported on 04/08/2024), Disp: 30 tablet, Rfl: 1   No Known Allergies   Social History   Tobacco Use   Smoking status: Never   Smokeless tobacco: Never  Substance Use Topics   Alcohol use: Yes   Drug use: Never      Chart Review Today: I personally reviewed active problem list, medication list, allergies, family history, social history, health maintenance, notes from last encounter, lab results, imaging with the patient/caregiver today.   Review of Systems  Constitutional:  Negative.   HENT: Negative.    Eyes: Negative.   Respiratory: Negative.    Cardiovascular: Negative.   Gastrointestinal: Negative.   Endocrine: Negative.   Genitourinary: Negative.   Musculoskeletal: Negative.   Skin: Negative.   Allergic/Immunologic: Negative.   Neurological: Negative.   Hematological: Negative.   Psychiatric/Behavioral: Negative.    All other systems reviewed and are negative.      Objective:   Vitals:   04/08/24 0926  BP: 128/70  Pulse: 75  Resp: 16  SpO2: 97%  Weight: 185 lb (83.9 kg)  Height: 4' 7 (1.397 m)    Body mass index is 43 kg/m.  Physical Exam Vitals and nursing note reviewed.  Constitutional:      General: She is not in acute distress.    Appearance: Normal appearance. She is well-developed. She is obese. She is not ill-appearing, toxic-appearing or diaphoretic.  HENT:     Head: Normocephalic and atraumatic.     Right Ear: External ear normal.     Left Ear: External ear normal.     Nose: Nose normal.   Eyes:     General: No scleral icterus.       Right eye: No discharge.        Left eye: No discharge.     Conjunctiva/sclera: Conjunctivae normal.   Neck:     Trachea: No tracheal  deviation.   Cardiovascular:     Rate and Rhythm: Normal rate.     Pulses: Normal pulses.     Heart sounds: Normal heart sounds.  Pulmonary:     Effort: Pulmonary effort is normal. No respiratory distress.     Breath sounds: No stridor.  Abdominal:     General: There is no distension.     Palpations: Abdomen is soft.     Tenderness: There is no abdominal tenderness.     Hernia: No hernia is present.   Skin:    General: Skin is warm and dry.     Coloration: Skin is not jaundiced.     Findings: No rash.   Neurological:     Mental Status: She is alert.     Motor: No abnormal muscle tone.     Coordination: Coordination normal.     Gait: Gait normal.   Psychiatric:        Mood and Affect: Mood normal.        Behavior: Behavior normal.       Results for orders placed or performed during the hospital encounter of 03/09/24  Hemoglobin A1c   Collection Time: 03/09/24 12:35 PM  Result Value Ref Range   Hgb A1c MFr Bld 6.8 (H) 4.8 - 5.6 %   Mean Plasma Glucose 148.46 mg/dL  Comprehensive Metabolic Panel (CMET)   Collection Time: 03/09/24 12:35 PM  Result Value Ref Range   Sodium 137 135 - 145 mmol/L   Potassium 3.3 (L) 3.5 - 5.1 mmol/L   Chloride 100 98 - 111 mmol/L   CO2 28 22 - 32 mmol/L   Glucose, Bld 122 (H) 70 - 99 mg/dL   BUN 11 6 - 20 mg/dL   Creatinine, Ser 9.46 0.44 - 1.00 mg/dL   Calcium 8.7 (L) 8.9 - 10.3 mg/dL   Total Protein 7.8 6.5 - 8.1 g/dL   Albumin 4.1 3.5 - 5.0 g/dL   AST 839 (H) 15 - 41 U/L   ALT 172 (H) 0 - 44 U/L   Alkaline Phosphatase 92 38 - 126 U/L   Total Bilirubin 1.2 0.0 - 1.2 mg/dL   GFR, Estimated >39 >39 mL/min   Anion gap 9 5 - 15  CBC with Differential/Platelet   Collection Time: 03/09/24 12:35 PM  Result Value Ref Range   WBC 7.4 4.0 - 10.5 K/uL   RBC 4.17 3.87 - 5.11 MIL/uL   Hemoglobin 12.8 12.0 - 15.0 g/dL   HCT 63.0 63.9 - 53.9 %   MCV 88.5 80.0 - 100.0 fL   MCH 30.7 26.0 - 34.0 pg   MCHC 34.7 30.0 - 36.0 g/dL   RDW 87.4 88.4 - 84.4 %   Platelets 305 150 - 400 K/uL   nRBC 0.0 0.0 - 0.2 %   Neutrophils Relative % 57 %   Neutro Abs 4.2 1.7 - 7.7 K/uL   Lymphocytes Relative 32 %   Lymphs Abs 2.4 0.7 - 4.0 K/uL   Monocytes Relative 6 %   Monocytes Absolute 0.5 0.1 - 1.0 K/uL   Eosinophils Relative 4 %   Eosinophils Absolute 0.3 0.0 - 0.5 K/uL   Basophils Relative 0 %   Basophils Absolute 0.0 0.0 - 0.1 K/uL   Immature Granulocytes 1 %   Abs Immature Granulocytes 0.04 0.00 - 0.07 K/uL  Urine Culture   Collection Time: 03/09/24 12:40 PM   Specimen: Urine, Random  Result Value Ref Range   Specimen Description  URINE, RANDOM Performed at [facility], [address] [place], [place] [address]    Special Requests      URINE Performed at [facility], [address] [place], [place] [address]    Culture      NO GROWTH Performed at [facility], [address] [place], [place] [address]    Report Status 03/10/2024 FINAL   Urinalysis, Routine w reflex microscopic -   Collection Time: 03/09/24 12:40 PM  Result Value Ref Range   Color, Urine YELLOW (A) YELLOW   APPearance HAZY (A) CLEAR   Specific Gravity, Urine 1.025 1.005 - 1.030   pH 6.0 5.0 - 8.0   Glucose, UA NEGATIVE NEGATIVE mg/dL   Hgb urine dipstick NEGATIVE NEGATIVE   Bilirubin Urine NEGATIVE NEGATIVE   Ketones, ur NEGATIVE NEGATIVE mg/dL   Protein, ur 30 (A) NEGATIVE mg/dL   Nitrite NEGATIVE NEGATIVE   Leukocytes,Ua NEGATIVE NEGATIVE   RBC / HPF 0-5 0 - 5 RBC/hpf   WBC, UA 0-5 0 - 5 WBC/hpf   Bacteria, UA RARE (A) NONE SEEN   Squamous Epithelial / HPF 0-5 0 - 5 /HPF   Mucus PRESENT   Microalbumin / creatinine urine ratio   Collection Time: 03/09/24 12:40 PM  Result Value Ref Range   Microalb, Ur 32.7 (H) Not Estab. ug/mL   Microalb Creat Ratio 16 0 - 29 mg/g creat   Creatinine, Urine 201.1 Not Estab. mg/dL       Assessment & Plan:   1. Elevated LFTs (Primary) Repeat LFTs and add acute hepatitis panel  Avoid tylenol , supplements, ETOH If still elevated need to get US  completed - encouraged pt to call [facility] for help with cost - Comprehensive metabolic panel with GFR; Future - Hepatitis, Acute; Future  2. New onset type 2 diabetes mellitus (HCC) She agrees to start metformin , will do only 500 mg once daily with food And she will also work on diet/lifestyle - discussed diet efforts - Comprehensive metabolic panel with GFR; Future  3. Pain of upper abdomen Sig improved to resolved with PPI - likely GERD/gastritis          Michelene Cower, PA-C 04/08/24 9:44 AM

## 2024-04-09 ENCOUNTER — Ambulatory Visit: Payer: Self-pay | Admitting: Family Medicine

## 2024-04-09 ENCOUNTER — Other Ambulatory Visit: Payer: Self-pay | Admitting: Licensed Clinical Social Worker

## 2024-04-09 LAB — HEPATITIS PANEL, ACUTE
HCV Ab: NONREACTIVE
Hep A IgM: NONREACTIVE
Hep B C IgM: NONREACTIVE
Hepatitis B Surface Ag: NONREACTIVE

## 2024-04-09 NOTE — Patient Outreach (Signed)
 Pt called twice with spanish interpretor [phone] and [phone]. Left voicemail requesting callback

## 2024-04-21 ENCOUNTER — Telehealth: Payer: Self-pay

## 2024-04-21 NOTE — Progress Notes (Signed)
 Complex Care Management Note Care Guide Note  04/21/2024 Name: Jessica Bright MRN: [id number] DOB: 26-Jun-1976   Complex Care Management Outreach Attempts: An unsuccessful telephone outreach was attempted today to offer the patient information about available complex care management services.  Follow Up Plan:  Additional outreach attempts will be made to offer the patient complex care management information and services.   Encounter Outcome:  No Answer  Dreama [facility]  [facility], [facility] Health Care Management Assistant Direct Dial: [phone]  Fax: [phone]

## 2024-04-22 NOTE — Progress Notes (Signed)
 Complex Care Management Note Care Guide Note  04/22/2024 Name: Jessica Bright MRN: [id number] DOB: 02/04/1976   Complex Care Management Outreach Attempts: A second unsuccessful outreach was attempted today to offer the patient with information about available complex care management services.  Follow Up Plan:  Additional outreach attempts will be made to offer the patient complex care management information and services.   Encounter Outcome:  No Answer  Dreama [facility]  [facility], [facility] Health Care Management Assistant Direct Dial: [phone]  Fax: [phone]

## 2024-04-30 NOTE — Progress Notes (Signed)
 Complex Care Management Note  Care Guide Note 04/30/2024 Name: [name] MRN: [id number] DOB: 16-Jun-1976  Jessica Bright is a 48 y.o. year old female who sees Leavy Mole, PA-C for primary care. I reached out to Chakira Golaszewski by phone today to offer complex care management services.  [name] was given information about Complex Care Management services today including:   The Complex Care Management services include support from the care team which includes your Nurse Care Manager, Clinical Social Worker, or Pharmacist.  The Complex Care Management team is here to help remove barriers to the health concerns and goals most important to you. Complex Care Management services are voluntary, and the patient may decline or stop services at any time by request to their care team member.   Complex Care Management Consent Status: Patient agreed to services and verbal consent obtained.   Follow up plan:  Telephone appointment with complex care management team member scheduled for:  05/06/24 & 05/18/24.  Encounter Outcome:  Patient Scheduled  Dreama [facility]  [facility], [facility] Health Care Management Assistant Direct Dial: [phone]  Fax: [phone]

## 2024-05-06 ENCOUNTER — Other Ambulatory Visit: Payer: Self-pay

## 2024-05-06 NOTE — Patient Outreach (Signed)
 Complex Care Management   Visit Note  05/06/2024  Name:  Jessica Bright MRN: [id number] DOB: Oct 03, 1976  Situation: Referral received for Complex Care Management related to Insurance I obtained verbal consent from Patient.  Visit completed with patient  on the phone  Background:   Past Medical History:  Diagnosis Date   GERD (gastroesophageal reflux disease)    Hypertension     Assessment: SW completed a telephone outreach with patient using an interpreter. Patient states she is needing insurance. Patient states she has not applied for Medicaid. Patient agreed to go to the department of social services to apply. She does know where it is located in [facility] because she has applied for her children and is receiving foodstamps. Patient has no SDOH needs at this time.    Recommendation:   No recommendations at this time.  Follow Up Plan:   SW will follow up on 06/07/24 at 11am  Thersia Hoar, BSW, [facility]  Value Based [facility] Social Worker, Population Health [phone]

## 2024-05-06 NOTE — Patient Instructions (Signed)
 Visit Information  Thank you for taking time to visit with me today. Please don't hesitate to contact me if I can be of assistance to you before our next scheduled appointment.  Our next appointment is by telephone on 06/07/24 at 11am Please call the care guide team at [phone] if you need to cancel or reschedule your appointment.   Following is a copy of your care plan:   Goals Addressed             This Visit's Progress    BSW VBCI Social Work Care Plan       Problems:   Health Insurance  CSW Clinical Goal(s):   Over the next 30 days the Patient will work with DSS to address needs related to Medicaid.  Interventions:  SW provided patient with information needed to apply for Medicaid  Patient Goals/Self-Care Activities:  Patient will go to [facility] DSS to apply for Medicaid  Plan:   The care management team will reach out to the patient again over the next 30 days.        Please call the Suicide and Crisis Lifeline: 988 call the [place]  National Suicide Prevention Lifeline: 606-596-2196 or TTY: (878)752-8763 TTY 216-798-4865) to talk to a trained counselor call 1-800-273-TALK (toll free, 24 hour hotline) call 911 if you are experiencing a Mental Health or Behavioral Health Crisis or need someone to talk to.  The patient verbalized understanding of instructions, educational materials, and care plan provided today and DECLINED offer to receive copy of patient instructions, educational materials, and care plan.   Thersia Hoar, HEDWIG, MHA [place]  [facility] Social Worker, Population Health [phone]

## 2024-05-11 ENCOUNTER — Telehealth: Payer: Self-pay

## 2024-05-11 NOTE — Progress Notes (Signed)
 Complex Care Management Care Guide Note  05/11/2024 Name: Jessica Bright MRN: [id number] DOB: 1976-03-14  Jessica Bright is a 48 y.o. year old female who is a primary care patient of Tapia, Leisa, PA-C and is actively engaged with the care management team. I reached out to Lucienne Jo by phone today to assist with re-scheduling  with the Pharmacist.  Follow up plan: Telephone appointment with complex care management team member scheduled for:  05/20/24 at 3:00 p.m.   Dreama [facility]  [facility], [facility] Health Care Management Assistant Direct Dial: [phone]  Fax: [phone]

## 2024-05-18 ENCOUNTER — Other Ambulatory Visit: Payer: Self-pay

## 2024-05-18 NOTE — Patient Outreach (Signed)
 Complex Care Management   Visit Note  05/18/2024  Name:  Jessica Bright MRN: [id number] DOB: May 29, 1976  Situation: Referral received for Complex Care Management related to Diabetes with Complications I obtained verbal consent from Patient.  Visit completed with Patient  on the phone  Background:   Past Medical History:  Diagnosis Date   GERD (gastroesophageal reflux disease)    Hypertension     Assessment: Patient Reported Symptoms:  Cognitive Cognitive Status: Alert and oriented to person, place, and time, Insightful and able to interpret abstract concepts, Normal speech and language skills Cognitive/Intellectual Conditions Management [RPT]: None reported or documented in medical history or problem list   Health Maintenance Behaviors: Annual physical exam Healing Pattern: Unsure Health Facilitated by: Rest  Neurological Neurological Review of Symptoms: Headaches Neurological Comment: discussed hydrating more  HEENT HEENT Symptoms Reported: No symptoms reported HEENT Management Strategies: Routine screening    Cardiovascular Cardiovascular Symptoms Reported: Chest pain or discomfort Does patient have uncontrolled Hypertension?: No Cardiovascular Comment: reports occasionally checking BP - last week 145/83, reports occasional chest pain, last event about 3 days ago with some SOB, rubbed chest with improvement - recently started, has not shared with PCP, RNCM will notify, ED precautions given  Respiratory Respiratory Symptoms Reported: No symptoms reported    Endocrine Is patient diabetic?: Yes Is patient checking blood sugars at home?: No Endocrine Self-Management Outcome: 3 (uncertain) Endocrine Comment: discussed symptoms of high and low BG, aware sending written information, discusse importance of taking medication as ordered and affects of DM on body - diet, exercise and medicaiton to prevent complications  Gastrointestinal Gastrointestinal Symptoms Reported: Abdominal pain  or discomfort Additional Gastrointestinal Details: reports stomach pain/get very gassy, occurs even when has not eaten, pantoprazole  helpng with this Gastrointestinal Management Strategies: Medication therapy    Genitourinary Genitourinary Symptoms Reported: Frequency Additional Genitourinary Details: reports frequency started 3-4 months ago, improved with metformin , was gettig up 6x at night, now only about 3 - aware to discuss with PCP if not improved Genitourinary Management Strategies: Medication therapy  Integumentary Integumentary Symptoms Reported: No symptoms reported Additional Integumentary Details: strong reaction to mosquito bites    Musculoskeletal Musculoskelatal Symptoms Reviewed: No symptoms reported   Falls in the past year?: No Number of falls in past year: 1 or less Was there an injury with Fall?: No Fall Risk Category Calculator: 0 Patient Fall Risk Level: Low Fall Risk Patient at Risk for Falls Due to: No Fall Risks Fall risk Follow up: Falls evaluation completed, Falls prevention discussed  Psychosocial Psychosocial Symptoms Reported: No symptoms reported     Quality of Family Relationships: unable to assess Do you feel physically threatened by others?: No      05/18/2024   11:45 AM  Depression screen PHQ 2/9  Decreased Interest 0  Down, Depressed, Hopeless 0  PHQ - 2 Score 0    There were no vitals filed for this visit.  Medications Reviewed Today     Reviewed by Devra Lands, RN (Registered Nurse) on 05/18/24 at 1113  Med List Status: <None>   Medication Order Taking? Sig Documenting Provider Last Dose Status Informant  metFORMIN  (GLUCOPHAGE ) 500 MG tablet [id number] Yes Take 1 tablet (500 mg total) by mouth daily with breakfast. Tapia, Leisa, PA-C  Active   pantoprazole  (PROTONIX ) 40 MG tablet [id number] Yes Take 1 tablet (40 mg total) by mouth daily. Leavy Mole, PA-C  Active             Recommendation:   PCP Follow-up  Continue Current  Plan of Care  Follow Up Plan:   Telephone follow-up 2 Gwendelyn Lanting  Nestora Duos, MSN, RN [facility]  [facility], [facility] RN Care Manager Direct Dial: [phone] Fax: [phone]

## 2024-05-18 NOTE — Patient Instructions (Addendum)
 Visit Information  Thank you for taking time to visit with me today. Please don't hesitate to contact me if I can be of assistance to you before our next scheduled appointment.  Our next appointment is by telephone on 06/01/2024 at 10:00 am Please call the care guide team at [phone] if you need to cancel or reschedule your appointment.   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  Goal: Over the next 90  days the Patient will attend all scheduled medical appointments: No missed appointments as evidenced by chart review        continue to work with RN Care Manager and/or Social Worker to address care management and care coordination needs related to DMII and Elevated Liver Enzymes as evidenced by adherence to care management team scheduled appointments     take all medications exactly as prescribed and will call provider for medication related questions as evidenced by Patient reporting compliance with all prescribed medications    verbalize basic understanding of DMII and Elevated Liver Enzymes disease process and self health management plan as evidenced by Patient taking all medicaitons as prescribed, making lifestyle changes as discussed including low carb/low fat diet, avoiding tylenol , ETOH, OTC vitamins/minerals/herbal supplements unless discussed with PCP first, limit processed foods, begin exercise (walking) program, report signs/symptoms of elevated or low BG, worsening GI/abdominal symptoms, any concerns/changes to PCP  Interventions:   Diabetes Interventions: Assessed patient's understanding of A1c goal: currently 6.8,reduce AIC to  pre diabetic levels Provided education to patient about basic DM disease process Reviewed medications with patient and discussed importance of medication  adherence Discussed plans with patient for ongoing care management follow up and provided patient with direct contact information for care management team Provided patient with written educational materials related to hypo and hyperglycemia and importance of correct treatment Referral made to social work team for assistance with Insurance and SDOH Food/utilities Review of patient status, including review of consultants reports, relevant laboratory and other test results, and medications completed Screening for signs and symptoms of depression related to chronic disease state  Assessed social determinant of health barriers Lab Results  Component Value Date   HGBA1C 6.8 (H) 03/09/2024    Patient Self-Care Activities:  Attend all scheduled provider appointments Call pharmacy for medication refills 3-7 days in advance of running out of medications Call provider office for new concerns or questions  Take medications as prescribed   Work with the social worker to address care coordination needs and will continue to work with the clinical team to address health care and disease management related needs check feet daily for cuts, sores or redness drink 6 to 8 glasses of water each day fill half of plate with vegetables manage portion size switch to low-fat or skim milk switch to sugar-free drinks wash and dry feet carefully every day wear comfortable, cotton socks wear comfortable, well-fitting shoes  Plan:  Telephone follow up appointment with care management team member scheduled for:  06/01/2024 at 10:00 am             Please call the Suicide and Crisis Lifeline: 988 call the [place]  National Suicide Prevention Lifeline: (309)020-3013 or TTY: 5065191714 TTY [phone]) to talk to a  trained counselor call 1-800-273-TALK (toll free, 24 hour hotline) go to [facility] [address] [place] [id number]) call 911 if you are experiencing a  Mental Health or Behavioral Health Crisis or need someone to talk to.  The patient verbalized understanding of instructions, educational materials, and care plan provided today and agreed to receive a mailed copy of patient instructions, educational materials, and care plan.   Nestora Duos, MSN, RN   [facility], [facility] RN Care Manager Direct Dial: [phone] Fax: [phone]  Diabetes mellitus y nutricin, en adultos Diabetes Mellitus and Nutrition, Adult Si sufre de diabetes, o diabetes mellitus, es muy importante tener hbitos alimenticios saludables debido a que sus niveles de Psychologist, counselling sangre (glucosa) se ven afectados en gran medida por lo que come y bebe. [place] alimentos saludables en las cantidades correctas, aproximadamente a la misma hora todos los [place] ayudar a: Chief Operating Officer su glucemia. Disminuir el riesgo de sufrir una enfermedad cardaca. Mejorar la presin arterial. Barista o mantener un peso saludable. Qu puede afectar mi plan de alimentacin? Todas las personas que sufren de diabetes son diferentes y cada una tiene necesidades diferentes en cuanto a un plan de alimentacin. El mdico puede recomendarle que trabaje con un nutricionista para elaborar el mejor plan para usted. Su plan de alimentacin puede variar segn factores como: Las caloras que necesita. Los medicamentos que toma. Su peso. Sus niveles de glucemia, presin arterial y colesterol. Su nivel de [place]. Otras afecciones que tenga, como enfermedades cardacas o renales. Cmo me afectan los carbohidratos? Los carbohidratos, o hidratos de carbono, afectan su nivel de glucemia ms que cualquier otro tipo de alimento. La ingesta de carbohidratos aumenta la cantidad de [employer]. Es importante conocer la cantidad de carbohidratos que se pueden ingerir en cada comida sin correr Surveyor, minerals. Esto es Government social research officer. Su nutricionista puede  ayudarlo a calcular la cantidad de carbohidratos que debe ingerir en cada comida y en cada refrigerio. Cmo me afecta el alcohol? El alcohol puede provocar una disminucin de la glucemia (hipoglucemia), especialmente si usa  insulina o toma determinados medicamentos por va oral para la diabetes. La hipoglucemia es una afeccin potencialmente mortal. Los sntomas de la hipoglucemia, como somnolencia, mareos y confusin, son similares a los sntomas de haber consumido demasiado alcohol. No beba alcohol si: Su mdico le indica no hacerlo. Est embarazada, puede estar embarazada o est tratando de quedar embarazada. Si bebe alcohol: Limite la cantidad que bebe a lo siguiente: De 0 a 1 medida por da para las mujeres. De 0 a 2 medidas por da para los hombres. Sepa cunta cantidad de alcohol hay en las bebidas que toma. En los [address], una medida equivale a una botella de cerveza de 12 oz (355 ml), un vaso de vino de 5 oz (148 ml) o un vaso de una bebida alcohlica de alta graduacin de 1 oz (44 ml). Mantngase hidratado bebiendo agua, refrescos dietticos o t helado sin azcar. Tenga en cuenta que los refrescos comunes, los jugos y otras bebidas para mezclar pueden contener Product/process development scientist y se deben contar como carbohidratos. Consejos para seguir Social worker las etiquetas de los alimentos Comience por leer el tamao de la porcin en la etiqueta de Informacin nutricional de los alimentos envasados y las bebidas. La cantidad de caloras, carbohidratos, grasas y otros nutrientes detallados en la etiqueta se basan en una porcin del alimento. Muchos alimentos contienen ms de [employer] Products [employer]s  porcin por envase. Verifique la cantidad total de gramos (g) de carbohidratos totales en una porcin. Verifique la cantidad de gramos de grasas saturadas y grasas trans en una porcin. Escoja alimentos que no contengan estas grasas o que su contenido de estas sea Piru. Verifique la cantidad de miligramos (mg) de sal  (sodio) en una porcin. La [employer]son de las personas deben limitar la ingesta de sodio total a menos de 2300 mg Google. Siempre consulte la informacin nutricional de los alimentos etiquetados como "con bajo contenido de grasa" o "sin grasa". Estos alimentos pueden tener un mayor contenido de International aid/development worker agregada o carbohidratos refinados, y deben evitarse. Hable con su nutricionista para identificar sus objetivos diarios en cuanto a los nutrientes mencionados en la etiqueta. Al ir de compras Evite comprar alimentos procesados, enlatados o precocidos. Estos alimentos tienden a tener una mayor cantidad de grasa, sodio y azcar agregada. Compre en la zona exterior de la tienda de comestibles. Esta es la zona donde se encuentran con mayor frecuencia las frutas y las verduras frescas, los cereales a granel, las carnes frescas y los productos lcteos frescos. Al cocinar Use mtodos de coccin a baja temperatura, como hornear, en lugar de mtodos de coccin a alta temperatura, como frer en abundante aceite. Cocine con aceites saludables, como el aceite de oliva, canola o girasol. Evite cocinar con manteca, crema o carnes con alto contenido de grasa. Planificacin de las comidas Coma las comidas y los refrigerios regularmente, preferentemente a la misma hora todos [place]. Evite pasar largos perodos de tiempo sin comer. Consuma alimentos ricos en fibra, como frutas frescas, verduras, frijoles y cereales integrales. Consuma entre 4 y 6 onzas (entre 112 y 168 g) de protenas magras por da, como carnes [place], pollo, pescado, huevos o tofu. Una onza (oz) (28 g) de protena magra equivale a: 1 onza (28 g) de carne, pollo o pescado. 1 huevo.  taza (62 g) de tofu. Coma algunos alimentos por da que contengan grasas saludables, como aguacates, frutos secos, semillas y pescado. Qu alimentos debo comer? Jessica Bright. Manzanas. Naranjas. Duraznos. Damascos. Ciruelas. Uvas. Mangos. Papayas. Granadas. Kiwi.  Cerezas. Verduras Verduras de [employer], que incluyen [place], espinaca, col rizada, acelga, hojas de berza, hojas de mostaza y repollo. Remolachas. Coliflor. Brcoli. Zanahorias. Judas verdes. Tomates. Pimientos. Cebollas. Pepinos. Coles de Bruselas. Granos Granos integrales, como panes, galletas, tortillas, cereales y pastas de salvado o integrales. Avena sin azcar. Quinua. Arroz integral o salvaje. Carnes y otras protenas Frutos de mar. Carne de ave sin piel. Cortes magros de ave y carne de res. Tofu. Frutos secos. Semillas. Lcteos Productos lcteos sin grasa o con bajo contenido de grasa, como leche, yogur y [place]. Es posible que los productos detallados arriba no constituyan una lista completa de los alimentos y las bebidas que puede tomar. Consulte a un nutricionista para obtener ms informacin. Qu alimentos debo evitar? Jessica Frutas enlatadas al almbar. Verduras Verduras enlatadas. Verduras congeladas con mantequilla o salsa de crema. Granos Productos elaborados con [place] y [place], como panes, pastas, bocadillos y cereales. Evite todos los alimentos procesados. Carnes y [address] de carne con alto contenido de Holiday representative. Carne de ave con piel. Carnes empanizadas o fritas. Carne procesada. Evite las grasas saturadas. Lcteos Yogur, queso o Cardinal Health. Bebidas Bebidas azucaradas, como gaseosas o t helado. Es posible que los productos que se enumeran ms [place] no constituyan una lista completa de los alimentos y las bebidas que Personnel officer. Consulte a un nutricionista para  obtener ms informacin. Preguntas para hacerle al mdico Debo consultar con un especialista certificado en atencin y educacin sobre la diabetes? Es necesario que me rena con un nutricionista? A qu nmero puedo llamar si tengo preguntas? Cules son los mejores momentos para controlar la glucemia? Dnde encontrar ms informacin: American Diabetes Association  (Asociacin Estadounidense de la Diabetes): diabetes.org Academy of Nutrition and Dietetics (Academia de Nutricin y Diettica): eatright.Dana Corporation of Diabetes and Digestive and Kidney Diseases [employer] & Company de la Diabetes y las Enfermedades Digestivas y Renales): StageSync.si Association of Diabetes Care & Education Specialists (Asociacin de Especialistas en Atencin y Educacin sobre la Diabetes): diabeteseducator.org Resumen Es importante tener hbitos alimenticios saludables debido a que sus niveles de Psychologist, counselling sangre (glucosa) se ven afectados en gran medida por lo que come y bebe. Es importante consumir alcohol con prudencia. Un plan de comidas saludable lo ayudar a controlar la glucosa en sangre y a reducir el riesgo de enfermedades cardacas. El mdico puede recomendarle que trabaje con un nutricionista para elaborar el mejor plan para usted. Esta informacin no tiene Theme park manager el consejo del mdico. Asegrese de hacerle al mdico cualquier pregunta que tenga. Document Revised: 06/14/2020 Document Reviewed: 06/14/2020 Elsevier Patient Education  2024 Elsevier Inc.   Hiperglucemia Hyperglycemia La hiperglucemia ocurre cuando la cantidad de azcar, o glucosa, en la sangre es demasiado alta. El nivel alto de azcar en la sangre puede ocurrir tanto si tiene diabetes como si no tiene diabetes. Puede ser ignacia emergencia. Cules son las causas? Si tiene diabetes, el Restaurant manager, fast food de azcar en la sangre puede deberse a lo siguiente: Medicamentos que Pensions consultant. No administrarse suficiente insulina (si la usa ). Ser menos activo de lo normal. Comer ms de lo previsto. Enfermedad, una lesin o una infeccin. Someterse a [place]. Estrs. Si no tiene diabetes, el nivel alto de azcar en la sangre puede deberse a lo siguiente: Ciertos medicamentos, como los corticoesteroides o los diurticos tiazdicos. Estrs. Una enfermedad o  infeccin grave. Someterse a [place]. Enfermedades del pncreas. Qu incrementa el riesgo? Es ms probable que tenga un nivel alto de azcar en la sangre si: Alguien de su familia tiene diabetes. Tiene sobrepeso. No hace actividad fsica. Tiene o ha tenido lo siguiente: Prediabetes. Diabetes durante el embarazo. Sndrome del ovario poliqustico (SOP). Cules son los signos o sntomas? Es posible que el nivel alto de azcar en la sangre no cause sntomas. Si tiene sntomas, pueden incluir los siguientes: Estar ms sediento que lo habitual. Necesidad de hacer pis con mayor frecuencia que lo normal. Hambre. Mucho cansancio. Visin borrosa. Puede tener otros sntomas si no se trata el nivel alto de Banker. Pueden incluir: Tree surgeon. Dolor de cabeza. Debilidad. Prdida de peso no planificada. Hormigueo o adormecimiento en las manos o los pies. Cortes o moretones que tardan en curarse. Cmo se diagnostica?  El nivel alto de azcar en la sangre se diagnostica con un anlisis de [place]. Ese anlisis le indica cunta azcar hay en la sangre. Se realiza mientras tiene sntomas.  El mdico tambin puede hacerle un examen fsico, revisar sus antecedentes mdicos y hacerle ms anlisis de sangre. Estas pruebas pueden incluir lo siguiente: Prueba de la glucemia en ayunas (PGA). No puede comer durante al menos 8 horas antes de esta prueba. Anlisis de A1c. Prueba de tolerancia a la glucosa. Cmo se trata? El tratamiento puede incluir: Tomar medicamentos para Chief Operating Officer los niveles de azcar en  la sangre. Cambiar los medicamentos o la cantidad que se aplica si recibe insulina o toma otros medicamentos para la diabetes. Controlarse el nivel de azcar en la sangre con mayor frecuencia. Hacer cambios en su vida diaria. Pueden incluir: Ser ms activo. Consumir alimentos ms saludables. [place] de [place]. Tratar una enfermedad o afeccin. Dejar de tomar  corticoesteroides o Scientist, clinical (histocompatibility and immunogenetics). Si su nivel alto de Academic librarian, es posible que deba recibir tratamiento en el hospital. Siga estas instrucciones en su casa: Si usted tiene diabetes:  Conozca los sntomas de nivel alto de azcar en la sangre. Siga su plan de atencin de la diabetes. Asegrese de hacer lo siguiente: Aplquese la insulina y tome los medicamentos como se lo hayan indicado. Contrlese el nivel de azcar en la sangre con la frecuencia que le hayan indicado. Coma a horario. No omita comidas. Controle su nivel de azcar en la sangre antes y despus de ejercitarse. Si hace ejercicio durante ms tiempo o con mayor esfuerzo de lo habitual, controle su nivel de azcar en la sangre con mayor frecuencia. Siga su plan para los das de enfermedad cuando no pueda comer ni beber normalmente. Elabore este plan de antemano con el mdico. Comparta su plan de atencin de la diabetes con: Sus compaeros de trabajo o de la escuela. Las personas con las que [place]. Use un brazalete o lleve consigo una tarjeta que diga que tiene diabetes. Instrucciones generales Use los medicamentos solamente como se lo haya indicado el mdico. Beba suficiente lquido para mantener el pis (orina) de color amarillo plido. Asegrese de beber suficiente lquido cuando: Realice actividad fsica. Se enferme. Est en lugares calurosos. Si bebe alcohol: Limite la cantidad que bebe a lo siguiente: De 0 a 1 medida al da si es [address]. De 0 a 2 medidas al da si es varn. Sepa cunta cantidad de alcohol hay en las bebidas que toma. En los [address], una medida es una botella de cerveza de 12 oz (355 ml), un vaso de vino de 5 oz (148 ml) o un vaso de una bebida alcohlica de alta graduacin de 1 oz (44 ml). Controle el estrs. Si necesita ayuda para lograrlo, consulte a su mdico. Haga ejercicio segn las indicaciones. Intente mantener un peso saludable. Concurra a todas las visitas de  seguimiento. El mdico querr asegurarse de que se trate el nivel alto de Banker. Dnde obtener ms informacin American Diabetes Association (ADA) (Asociacin Estadounidense de la Diabetes): diabetes.org Comunquese con un mdico si: Tiene diabetes y tiene problemas para Futures trader de azcar en la sangre dentro del rango adecuado. El nivel de azcar en la sangre es igual o mayor que 240 mg/dl (86.6 mmol/dl) durante 2 das seguidos. Tiene un nivel de azcar en la sangre alto con frecuencia. Tiene signos de enfermedad, por ejemplo: Nuseas o vmitos. Dolor de cabeza. [place]. No puede dejar de vomitar. Solicite ayuda de inmediato si: El monitor de azcar en la sangre indica un nivel "alto" incluso cuando se est administrando insulina. Tiene dificultad para respirar. Estos sntomas pueden Customer service manager. Llame al 911 de inmediato. No espere a ver si los sntomas desaparecen. No conduzca por sus propios medios [employer]. Esta informacin no tiene Theme park manager el consejo del mdico. Asegrese de hacerle al mdico cualquier pregunta que tenga. Document Revised: 02/12/2023 Document Reviewed: 02/12/2023 Elsevier Patient Education  2024 Elsevier Inc.   Hipoglucemia Hypoglycemia La hipoglucemia ocurre cuando la cantidad de azcar,  o glucosa, en la sangre es demasiado baja. El nivel bajo de azcar en la sangre puede ocurrir tanto si tiene diabetes como si no tiene diabetes. Puede ser ignacia emergencia. Cules son las causas? El nivel bajo de azcar en la sangre ocurre con ms frecuencia en las personas que tienen diabetes. Las causas pueden ser las siguientes: Los medicamentos para la diabetes. No comer lo suficiente o no comer con la frecuencia suficiente. Hacer ms actividad fsica de lo normal. Si no tiene diabetes, igualmente puede tener un nivel bajo de azcar en la sangre si: Hay un tumor en el pncreas. Un tumor es un crecimiento de clulas que  no es normal. No come lo suficiente o ayuna. El ayuno implica no comer durante largos perodos a Licensed conveyancer. Tiene una infeccin o enfermedad muy grave. Tiene problemas despus de [place] de prdida de [place]. Tiene problemas de riones o de hgado. Toma ciertos medicamentos. Qu incrementa el riesgo? Es ms probable que tenga un nivel bajo de azcar en la sangre si: Tiene diabetes y se aplica o toma medicamentos para tratarla. Bebe mucho alcohol. Se enferma. Cules son los signos o sntomas? Leves Hambre o ganas de vomitar. Sudar y sentirse fro al tacto. Sentirse mareado o aturdido. Tener somnolencia o dificultad para dormir. Dolor de cabeza. Visin borrosa. Cambios en el humor. Estos incluyen sentir preocupacin, nerviosismo o molestarse fcilmente. Moderados Sentirse confundido. Cambios en su forma de actuar. Debilidad. Latido cardaco irregular. Muy intensos Tener un nivel muy bajo de azcar en la sangre es una emergencia. Puede ocasionar: Desmayos. Convulsiones. Coma. Muerte. Cmo se diagnostica?  El nivel bajo de azcar en la sangre puede hallarse con un anlisis de [place]. Ese anlisis le indica cunta azcar hay en la sangre. Se realiza mientras tiene sntomas. El mdico tambin puede hacerle un examen y revisar sus antecedentes mdicos. Cmo se trata? Tratamiento del nivel bajo de azcar en la sangre Si cree que tiene un nivel bajo de azcar en la sangre, coma o beba algo con azcar de inmediato. El alimento o bebida debe contener 15 gramos de un carbohidrato (carbohidrato) de accin rpida. Entre las opciones se incluyen las siguientes: 4 onzas (120 ml) de jugo de frutas. 4 onzas (120 ml) de refresco (que no sea diettico). Algunos caramelos duros. Lea las etiquetas de informacin nutricional para saber cuntas unidades consumir. 1 cucharada (15 ml) de azcar o miel. 4 tabletas de glucosa. 1 pomo de glucosa en gel. Tratamiento del nivel bajo de azcar en la  sangre si tiene diabetes Hable con su mdico acerca de qu cantidad de carbohidratos debera consumir. Si usted est alerta y puede tragar sin riesgos, siga la regla de 15/15: Consuma 15 gramos de un carbohidrato de accin rpida. Contrlese el nivel de azcar en la sangre 15 minutos despus de ingerir el carbohidrato. Si el nivel de azcar en la sangre todava es igual o menor que 70 mg/dl (3.9 mmol/l), ingiera nuevamente 15 gramos de un carbohidrato. Si el nivel de azcar en la sangre no supera los 70 mg/dl (3.9 mmol/l) despus de 3 intentos, solicite ayuda de inmediato. Ingiera una comida o una colacin en el transcurso de 1 hora despus de que el nivel de azcar en la sangre se haya normalizado. Lleve siempre consigo 15 gramos de carbohidratos de accin rpida. Estos podran ser: 4 tabletas de glucosa. Algunos caramelos duros. 1 cucharada (15 ml) de miel o azcar. 1 pomo de glucosa en gel. Tratamiento del nivel muy bajo de azcar en  la sangre Si su nivel de azcar en la sangre est por debajo de 54 mg/dl (3 mmol/l), se trata de una emergencia. Solicita ayuda de inmediato. Si no puede comer ni beber, ser necesario que Publishing rights manager. Un familiar o un amigo debe aprender a controlarle el azcar en la sangre y a darle el glucagn. Pregntele al mdico si debe contar con un kit de glucagn en su casa. Tambin es posible que deba recibir tratamiento en un hospital. Siga estas instrucciones en su casa: Si usted tiene diabetes: Lleve siempre consigo carbohidratos de accin rpida (15 gramos). Siga su plan de atencin de la diabetes. Asegrese de hacer lo siguiente: Conozca los sntomas de nivel bajo de azcar en la sangre. Contrlese el nivel de azcar en la sangre con la frecuencia que le hayan indicado. Contrleselo siempre antes y despus de Materials engineer. Controle siempre su nivel de azcar en la sangre antes de conducir. Tome los medicamentos segn las indicaciones. Coma a  horario. No omita comidas. Comparta su plan de atencin de la diabetes con: Sus compaeros de trabajo o de la escuela. Las personas con las que [place]. Use un brazalete o lleve consigo una tarjeta que diga que tiene diabetes. Instrucciones generales Si bebe alcohol: Limite la cantidad que bebe a lo siguiente: De 0 a 1 medida al da si es [place]. De 0 a 2 medidas al da si es varn. Sepa cunta cantidad de alcohol hay en las bebidas que toma. En los [address], una medida es una botella de cerveza de 12 oz (355 ml), un vaso de vino de 5 oz (148 ml) o un vaso de una bebida alcohlica de alta graduacin de 1 oz (44 ml). Asegrese de ingerir alimentos cuando beba alcohol. Asegrese de controlar su nivel de azcar en la sangre despus de beber. El alcohol puede provocar ms tarde un nivel bajo de azcar en la sangre. Dnde obtener ms informacin American Diabetes Association (ADA) (Asociacin Estadounidense de la Diabetes): diabetes.org Comunquese con un mdico si: Tiene un nivel de azcar en la sangre bajo con frecuencia. Tiene diabetes y tiene problemas para Futures trader de azcar en la sangre dentro del rango adecuado. Solicite ayuda de inmediato si: No puede obtener un nivel de azcar en la sangre superior a 70 mg/dl (3.9 mmol/l) despus de 3 intentos. Su nivel de azcar en la sangre es inferior a 54 mg/dl (3 mmol/l). Tiene una convulsin. Se desmaya. Estos sntomas pueden Customer service manager. Llame al 911 de inmediato. No espere a ver si los sntomas desaparecen. No conduzca por sus propios medios [employer]. Esta informacin no tiene Theme park manager el consejo del mdico. Asegrese de hacerle al mdico cualquier pregunta que tenga. Document Revised: 02/12/2023 Document Reviewed: 02/12/2023 Elsevier Patient Education  2024 Arvin[employer].

## 2024-05-20 ENCOUNTER — Other Ambulatory Visit: Payer: Self-pay

## 2024-05-20 DIAGNOSIS — E119 Type 2 diabetes mellitus without complications: Secondary | ICD-10-CM

## 2024-05-20 NOTE — Progress Notes (Signed)
   05/20/2024  Patient ID: Jessica Bright, female   DOB: May 19, 1976, 49 y.o.   MRN: [id number]  Patient stated that approximately two weeks ago, the option to apply for Medicaid was discussed. However, she remains hesitant to apply due to personal concerns.  She expressed interest in the [facility] program and reviewed the program details. The patient meets the income eligibility requirements and is open to utilizing the [facility] pharmacy location for medication access.  [facility] at [facility] [address], [place], [place] [address] [phone]  Discussed the cost of diabetic testing supplies with the patient, who was agreeable to the plan. Provided education on proper use of the glucometer and diabetic testing supplies, including recommended testing frequency.  Advised the patient to record blood glucose readings either in a mobile app or on paper and to bring this log to clinic visits for review.  Will collaborate with the patient's PCP regarding prescription and coordination of diabetic testing supplies. Patient is not yet due for a metformin  prescription renewal at this time. Reminded patient to ask for [facility] application when she picks up diabetic testing supplies.    Dorcas Solian, PharmD Clinical Pharmacist Cell: [phone]

## 2024-05-25 ENCOUNTER — Other Ambulatory Visit: Payer: Self-pay

## 2024-05-25 MED ORDER — BLOOD GLUCOSE TEST VI STRP
ORAL_STRIP | 1 refills | Status: AC
Start: 2024-05-25 — End: ?
  Filled 2024-05-25: qty 50, 50d supply, fill #0

## 2024-05-25 MED ORDER — BLOOD GLUCOSE MONITOR SYSTEM W/DEVICE KIT
PACK | 0 refills | Status: AC
  Filled 2024-05-25: qty 1, 30d supply, fill #0

## 2024-05-25 MED ORDER — LANCET DEVICE MISC
0 refills | Status: AC
  Filled 2024-05-25: qty 1, fill #0

## 2024-05-25 MED ORDER — TRUEPLUS LANCETS 28G MISC
1 refills | Status: AC
  Filled 2024-05-25: qty 100, 100d supply, fill #0

## 2024-06-01 ENCOUNTER — Other Ambulatory Visit: Payer: Self-pay

## 2024-06-01 ENCOUNTER — Telehealth: Payer: Self-pay

## 2024-06-01 NOTE — Patient Instructions (Signed)
 Informacin de la visita  Gracias por su visita de hoy. No dude en contactarme si necesita ayuda antes de nuestra prxima cita.  Su prxima cita de gestin de cuidados es por telfono el 06/29/2024 a las 10:00 a. m.  Si necesita cancelar, programar o reprogramar una cita, llame al equipo de gua de atencin al [phone].  Si tiene burkina faso crisis de salud mental o conductual o necesita hablar con alguien, por favor, contctenos.  Llame a la Lnea de Ayuda para el Suicidio y la Crisis: [phone] Llame a la Environmental consultant de Prevencin del Suicidio de [place] U[place].: [phone] o TTY: [phone] TTY [phone]) para hablar con un consejero capacitado Llame al 1-800-273-TALK (lnea directa gratuita, disponible las 24 horas) Acuda a la Atencin de [place] de Salud Conductual del [place] de [place], [address], [place] [phone]) Llame al 911  Nestora Duos, MSN, RN [facility]  [facility], [facility] RN Care Manager Direct Dial: [phone] Fax: [phone]  Hipertensin en los adultos Hypertension, Adult El trmino hipertensin es otra forma de denominar a la presin arterial elevada. La presin arterial elevada fuerza al corazn a trabajar ms para bombear la sangre. Esto puede causar problemas con el paso del [place]. Una lectura de presin arterial est compuesta por 2 nmeros. Hay un nmero superior (sistlico) sobre un nmero inferior (diastlico). Lo ideal es tener la presin arterial por debajo de 120/80. Cules son las causas? Se desconoce la causa de esta afeccin. Algunas otras afecciones pueden provocar presin arterial elevada. Qu incrementa el riesgo? Algunos factores del estilo de vida pueden hacer que tenga ms probabilidades de desarrollar presin arterial elevada: Fumar. No hacer la cantidad suficiente de actividad fsica o ejercicio. Tener sobrepeso. Consumir mucha grasa, azcar, caloras o sal (sodio) en su dieta. Beber alcohol  en exceso. Otros factores de riesgo son los siguientes: Tener alguna de estas afecciones: Enfermedad cardaca. Diabetes. Colesterol alto. Enfermedad renal. Apnea obstructiva del sueo. Tener antecedentes familiares de presin arterial elevada y colesterol elevado. Edad. El riesgo aumenta con la edad. Estrs. Cules son los signos o sntomas? Es posible que la presin arterial alta no cause sntomas. La presin arterial muy alta (crisis hipertensiva) puede provocar: Dolor de cabeza. Latidos cardacos acelerados o irregulares (palpitaciones). Falta de aire. Hemorragia nasal. Vomitar o sentir ganas de vomitar (nuseas). Cambios en la forma de ver. Dolor muy intenso en el pecho. Sensacin de mareo. Convulsiones. Cmo se trata? Esta afeccin se trata haciendo cambios saludables en el estilo de vida, por ejemplo: Consumir alimentos saludables. Hacer ms ejercicio. Beber menos alcohol. El mdico puede recetarle medicamentos si los cambios en el estilo de vida no son lo suficientemente eficaces y si: El nmero de arriba est por encima de 130. El nmero de abajo est por encima de 80. Su presin arterial personal ideal puede variar. Siga estas indicaciones en su casa: Comida y bebida  Si se lo dicen, siga el plan de alimentacin de DASH (Dietary Approaches to Stop Hypertension, Maneras de alimentarse para detener la hipertensin). Para seguir este plan: Llene la mitad del plato de cada comida con frutas y verduras. Llene un cuarto del plato de cada comida con cereales integrales. Los cereales integrales incluyen pasta integral, arroz integral y pan integral. Coma y beba productos lcteos con bajo contenido de grasa, como leche descremada o yogur bajo en grasas. Llene un cuarto del plato de cada comida con protenas bajas en grasa (magras). Las protenas bajas en grasa incluyen pescado, pollo sin piel, huevos, frijoles y  tofu. Evite consumir carne grasa, carne curada y procesada, o  pollo con piel. Evite consumir alimentos prehechos o procesados. Limite la cantidad de sal en su dieta a menos de 1500 mg por da. No beba alcohol si: El mdico le indica que no lo haga. Est embarazada, puede estar embarazada o est tratando de quedar embarazada. Si bebe alcohol: Limite la cantidad que bebe a lo siguiente: De 0 a 1 medida por da para las mujeres. De 0 a 2 medidas por da para los hombres. Sepa cunta cantidad de alcohol hay en las bebidas que toma. En los [address], una medida equivale a una botella de cerveza de 12 oz (355 ml), un vaso de vino de 5 oz (148 ml) o un vaso de una bebida alcohlica de alta graduacin de 1 oz (44 ml). Estilo de vida  Trabaje con su mdico para mantenerse en un peso saludable o para perder peso. Pregntele a su mdico cul es el peso recomendable para usted. Realice al menos 30 minutos de ejercicio que haga que se acelere su corazn (ejercicio Magazine features editor) la DIRECTV de la [place]. Estos pueden incluir caminar, nadar o andar en bicicleta. Realice al menos 30 minutos de ejercicio que fortalezca sus msculos (ejercicios de resistencia) al menos 3 das a la [place]. Estos pueden incluir levantar pesas o hacer [place]. No fume ni consuma ningn producto que contenga nicotina o tabaco. Si necesita ayuda para dejar de consumir estos productos, consulte al mdico. Controle su presin arterial en su casa tal como le indic el mdico. Concurra a todas las visitas de seguimiento. Medicamentos Use los medicamentos de venta libre y los recetados solamente como se lo haya indicado el mdico. Siga cuidadosamente las indicaciones. No omita las dosis de medicamentos para la presin arterial. Los medicamentos pierden eficacia si omite dosis. El hecho de omitir las dosis tambin lesotho el riesgo de otros problemas. Pregntele a su mdico a qu efectos secundarios o reacciones a los medicamentos debe prestar atencin. Comunquese con un mdico  si: Piensa que tiene burkina faso reaccin a los medicamentos que est tomando. Tiene dolores de cabeza frecuentes. Siente mareos. Tiene hinchazn en los tobillos. Tiene problemas de visin. Solicite ayuda de inmediato si: Siente un dolor de cabeza muy intenso. Empieza a sentirse desorientado (confundido). Se siente dbil o adormecido. Siente que va a desmayarse. Tiene un dolor muy intenso en: Pecho. Vientre (abdomen). Vomita ms de una vez. Tiene dificultad para respirar. Estos sntomas pueden Customer service manager. Solicite ayuda de inmediato. Llame al 911. No espere a ver si los sntomas desaparecen. No conduzca por sus propios medios [employer]. Resumen El trmino hipertensin es otra forma de denominar a la presin arterial elevada. La presin arterial elevada fuerza al corazn a trabajar ms para bombear la sangre. Para la [employer], una presin arterial normal es menor que 120/80. Las decisiones saludables pueden ayudarle a disminuir su presin arterial. Si no puede bajar su presin arterial mediante decisiones saludables, es posible que deba tomar medicamentos. Esta informacin no tiene Theme park manager el consejo del mdico. Asegrese de hacerle al mdico cualquier pregunta que tenga. Document Revised: 08/16/2021 Document Reviewed: 08/16/2021 Elsevier Patient Education  2024 Elsevier Inc.   Diabetes mellitus tipo 2, cuidados personales, en adultos Type 2 Diabetes Mellitus, Self-Care, Adult Las personas que tienen diabetes tipo 2 (diabetes mellitus tipo 2) deben mantener su nivel de azcar en la sangre (glucosa) dentro de un rango saludable. Es posible hacerlo por medio de  lo siguiente: Nutricin. Actividad fsica. Cambios en el estilo de vida. Medicamentos o insulina, si es necesario. [employer]e mdicos y de Economist. Cules son los riesgos? Tener diabetes tipo 2 puede aumentar el riesgo de tener otros problemas de salud a largo plazo  (crnicos). Puede recibir medicamentos para Interior and spatial designer. Cmo mantenerse informado sobre su nivel de azcar en la sangre  Controle su nivel de azcar en la sangre todos los das, con la frecuencia que le hayan indicado. Hgase controlar la A1c (hemoglobina A1c) dos o ms veces al ao. Hgase un control con ms frecuencia si se lo indican. El mdico fijar objetivos de tratamiento personales para usted. En general, los resultados de los niveles de azcar en la sangre deben ser los siguientes: Antes de las comidas: de 80 a 130 mg/dl (de 4.4 a 7.2 mmol/l). Despus de las comidas: por debajo de 180 mg/dl (10 mmol/l). A1c: menos del 7 %. Cmo controlar los niveles altos y bajos de azcar en la sangre Sntomas de nivel alto de azcar en la sangre Un nivel alto de azcar en la sangre tambin se denomina hiperglucemia. Conozca los sntomas de nivel alto de Banker. Estos pueden incluir: Ms sed. Hambre. Mucho cansancio. Necesidad de Geographical information systems officer con ms frecuencia de lo normal. Ver las cosas borrosas. Sntomas de nivel bajo de azcar en la sangre Un nivel bajo de azcar en la sangre se denomina hipoglucemia. Este cuadro ocurre cuando el nivel de azcar en la sangre es igual o menor que 70 mg/dl (3.9 mmol/l). Entre los sntomas, se pueden incluir los siguientes: [place]. Sentirse preocupado o nervioso (ansioso). Sentirse muy transpirado o sentirse pegajoso o fro al tacto (sudoroso). Sentirse mareado o aturdido. Sensacin de somnolencia. Latidos cardacos acelerados. Sensacin de malhumor (irritabilidad). Hormigueo o falta de sensibilidad (entumecimiento) alrededor de la boca, los labios o la [place]. Sueo agitado. Los medicamentos para la diabetes pueden causar niveles bajos de Banker. Tiene ms riesgo: Mientras hace ejercicio. Despus de hacer ejercicio. Durante el sueo. Cuando est enfermo. Al saltear comidas o no comer durante mucho tiempo. Tratamiento del  nivel bajo de azcar en la sangre Si cree que tiene un nivel bajo de azcar en la sangre, ingiera un alimento o una bebida azucarada de inmediato. Lleve consigo 15 gramos de un hidrato de carbono de accin rpida (carbohidratos) todo Museum/gallery conservator. Asegrese de que sus familiares y amigos sepan cmo tratarlo si no puede tratarse usted mismo. Tratamiento del nivel muy bajo de azcar en la sangre La hipoglucemia se considera grave cuando el nivel de azcar en la sangre es igual o menor que 54 mg/dl (3 mmol/l). La hipoglucemia grave es Radio broadcast assistant. Solicite atencin mdica de inmediato. Comunquese con el servicio de emergencias de su localidad (911 en los [place]). No espere a ver si los sntomas desaparecen. No conduzca por sus propios medios [employer]. Si su nivel de azcar en la sangre es muy bajo y no puede ingerir ningn alimento ni bebida, tal vez deba aplicarse una inyeccin de glucagn. Un familiar o un amigo debe aprender a controlarle el azcar en la sangre y a aplicarle una inyeccin de glucagn. Pregntele al mdico si debe tener un kit de inyecciones de glucagn. Siga estas instrucciones en su casa: Medicamentos Adminstrese la insulina o los medicamentos para la diabetes recetados como le haya indicado el mdico. No se quede sin insulina o sin otros medicamentos. Planifique con anticipacin. Si usa  insulina, cambie la cantidad  que toma segn su actividad y qu alimentos consume. El Primary school teacher a Electrical engineer. Use los medicamentos de venta libre y los recetados solamente como se lo haya indicado el mdico. Comida y bebida  Consuma alimentos saludables. Estos incluyen: Protenas con bajo contenido de grasa [place]). Hidratos de carbono complejos, como cereales integrales. Joretta y verduras frescas. Productos lcteos con bajo contenido de grasa. Grasas saludables. Renase con un experto en alimentacin (nutricionista) para elaborar un plan de alimentacin. Siga las  instrucciones del mdico respecto de lo que no puede comer o beber. Beba suficiente lquido para Radio producer pis (la orina) de color amarillo plido. Lleve un registro de los hidratos de carbono que consume. Lea las etiquetas de los alimentos y aprenda cules son los tamaos de las porciones de los alimentos. Siga su plan para los das de enfermedad cuando no pueda comer ni beber normalmente. [employer] plan con el mdico, de modo que est listo para usarlo. Actividad Haga ejercicios tal como le indic el mdico. Puede que deba hacer lo siguiente: Hacer ejercicios de elongacin y de fortalecimiento dos veces por semana o ms. Hacer 150 minutos o ms de ejercicio cada semana que haga que el corazn lata ms rpido y lo haga sudar. Reparta la actividad en 3 das por semana o ms. No deje pasar ms de 2 das seguidos sin hacer ejercicio. Hable con el mdico antes de comenzar una rutina de ejercicio nueva. Es posible que el mdico le indique que haga cambios en: La cantidad de insulina que se aplica o los medicamentos que toma. Cunto debe comer. Estilo de vida No fume ni consuma ningn producto que contenga nicotina o tabaco. Si necesita ayuda para dejar de consumir estos productos, consulte al mdico. Si bebe alcohol y el mdico considera que es seguro para usted: Limite la cantidad que bebe a lo siguiente: De 0 a 1 medida por da para las mujeres que no estn embarazadas. De 0 a 2 medidas por da para los hombres. Sepa cunta cantidad de alcohol hay en las bebidas que toma. En los [address], una medida equivale a una botella de cerveza de 12 oz (355 ml), un vaso de vino de 5 oz (148 ml) o un vaso de una bebida alcohlica de alta graduacin de 1 oz (44 ml). Aprenda a sobrellevar el estrs. Si necesita ayuda, consulte al [employer]. Cuidado del cuerpo  Mantngase al da con las vacunas (inmunizaciones). Hgase controlar los ojos y los pies por un mdico con la frecuencia que le hayan  indicado. Revise su piel y pies todos los [place]. Examine si tiene cortes, moretones, enrojecimiento, ampollas o llagas. Cepllese los dientes y las [address] veces al da. Psese hilo dental una o ms veces por da. Vaya al dentista una vez cada 6 meses o con ms frecuencia. Mantenga un peso saludable. Instrucciones generales Comparta su plan de atencin de la diabetes con: Sus compaeros de trabajo o de la escuela. Las personas con las que [place]. Lleve consigo una tarjeta, o use un brazalete o una medalla que indique que tiene diabetes. Concurra a todas las visitas de seguimiento. Preguntas para hacerle al mdico Debo consultar con un especialista certificado en educacin y atencin para la diabetes? Dnde puedo encontrar un grupo de apoyo? Dnde buscar ms informacin Para obtener ayuda y orientacin, as como ms informacin sobre la diabetes, visite: American Diabetes Association (Asociacin Estadounidense de la Diabetes): www.diabetes.org American Association of Diabetes Care and Education Specialists (Asociacin Estadounidense de Especialistas en  Atencin y [employer] Diabetes): www.diabeteseducator.org International Diabetes Federation Secondary school teacher de Diabetes): DCOnly.dk Resumen Las personas que tienen diabetes tipo 2 deben mantener su nivel de azcar en la sangre (glucosa) dentro de un rango saludable. Es posible hacerlo con la alimentacin, la actividad fsica, los medicamentos y la insulina y el respaldo de mdicos y otras personas. Contrlese el nivel de azcar en la sangre todos los das o con la frecuencia que le hayan indicado. Tener diabetes puede aumentar el riesgo de tener otros problemas de salud a largo plazo. Puede recibir medicamentos para Interior and spatial designer. Comparta su plan de control de la diabetes con sus compaeros de trabajo, de la escuela, y en casa. Concurra a todas las visitas de seguimiento. Esta informacin no tiene Microbiologist el consejo del mdico. Asegrese de hacerle al mdico cualquier pregunta que tenga. Document Revised: 01/25/2021 Document Reviewed: 01/25/2021 Elsevier Patient Education  2024 Arvin[employer].

## 2024-06-01 NOTE — Patient Outreach (Signed)
 Complex Care Management   Visit Note  06/01/2024  Name:  Jessica Bright MRN: [id number] DOB: [birth year]  Situation: Referral received for Complex Care Management related to Diabetes with Complications I obtained verbal consent from Patient.  Visit completed with patient  on the phone  Background:   Past Medical History:  Diagnosis Date   GERD (gastroesophageal reflux disease)    Hypertension     Assessment: Patient Reported Symptoms:  Cognitive Cognitive Status: No symptoms reported      Neurological Neurological Review of Symptoms: Dizziness Neurological Comment: occasional dizziness upon waking - lasting about 3 minutes, advised to get up slowly, sit on side of bed until relieved, avoid falls, decrease in headaches - reports drinking more water  HEENT HEENT Symptoms Reported: Not assessed      Cardiovascular Cardiovascular Symptoms Reported: No symptoms reported Does patient have uncontrolled Hypertension?: No Cardiovascular Self-Management Outcome: 4 (good) Cardiovascular Comment: Requested patient check BP - unable to find cuff at this time. Discussed BP should be below 130/90, call PCP if repeatedly higher and ED precautions/sx of stroke given  Respiratory Respiratory Symptoms Reported: No symptoms reported    Endocrine Endocrine Symptoms Reported: No symptoms reported Is patient diabetic?: Yes Is patient checking blood sugars at home?: Yes List most recent blood sugar readings, include date and time of day: Patient reports checking FBG about 3x week - last was 3 days ago 145, discussed importance of checking daily, recording and bringin to PCP appointments, denies problems/questions with process, states getting used to checking and will try to do regularly    Gastrointestinal Gastrointestinal Symptoms Reported: No symptoms reported Additional Gastrointestinal Details: pantoprozole discontinued      Genitourinary Additional Genitourinary Details: voiding 2x at  night Genitourinary Management Strategies: Medication therapy  Integumentary Integumentary Symptoms Reported: Not assessed    Musculoskeletal Musculoskelatal Symptoms Reviewed: No symptoms reported   Falls in the past year?: No Number of falls in past year: 1 or less Was there an injury with Fall?: No Fall Risk Category Calculator: 0 Patient Fall Risk Level: Low Fall Risk Patient at Risk for Falls Due to: No Fall Risks Fall risk Follow up: Falls evaluation completed, Falls prevention discussed  Psychosocial Psychosocial Symptoms Reported: No symptoms reported     Quality of Family Relationships: unable to assess Do you feel physically threatened by others?: No      06/01/2024   10:48 AM  Depression screen PHQ 2/9  Decreased Interest 0  Down, Depressed, Hopeless 0  PHQ - 2 Score 0    There were no vitals filed for this visit.  Medications Reviewed Today     Reviewed by Devra Lands, RN (Registered Nurse) on 06/01/24 at 1027  Med List Status: <None>   Medication Order Taking? Sig Documenting Provider Last Dose Status Informant  Blood Glucose Monitoring Suppl (BLOOD GLUCOSE MONITOR SYSTEM) w/Device KIT [id number] Yes Use to check blood glucose once daily. May substitute to TrueMetrix for cost. Tapia, Leisa, PA-C  Active   Glucose Blood (BLOOD GLUCOSE TEST STRIPS) STRP [id number] Yes Use to test blood glucose once daily. May substitute to TrueMetrix for cost. Leavy Michelene RIGGERS  Active   Lancet Device MISC [id number] Yes Use to test blood glucose once daily. May substitute to TrueMetrix for cost. Tapia, Leisa, PA-C  Active   metFORMIN  (GLUCOPHAGE ) 500 MG tablet [id number] Yes Take 1 tablet (500 mg total) by mouth daily with breakfast. Tapia, Leisa, PA-C  Active   pantoprazole  (PROTONIX ) 40 MG tablet [id number]  Take 1  tablet (40 mg total) by mouth daily.  Patient not taking: Reported on 06/01/2024   Leavy Mole, PA-C  Consider Medication Status and Discontinue   TRUEplus Lancets  28G MISC [id number] Yes Use to test blood glucose once daily. May substitute to TrueMetrix for cost. Tapia, Leisa, PA-C  Active             Recommendation:   PCP Follow-up Continue Current Plan of Care  Follow Up Plan:   Telephone follow-up in 1 month  Nestora Duos, MSN, RN [facility]  [facility], [facility] RN Care Manager Direct Dial: [phone] Fax: [phone]

## 2024-06-07 ENCOUNTER — Other Ambulatory Visit: Payer: Self-pay

## 2024-06-08 ENCOUNTER — Telehealth: Payer: Self-pay

## 2024-06-08 ENCOUNTER — Other Ambulatory Visit: Payer: Self-pay

## 2024-06-08 DIAGNOSIS — E119 Type 2 diabetes mellitus without complications: Secondary | ICD-10-CM

## 2024-06-08 NOTE — Progress Notes (Signed)
   06/08/2024  Patient ID: Jessica Bright, female   DOB: [birth year], 47 y.o.   MRN: [id number]  Attempted to contact patient for medication management/review. Left HIPAA compliant message for patient to return my call at their convenience.   Second attempt for patient outreach. Will follow up. I called   05/25/2024 - diabetics testing (test strips 7.53, lacent 1.51, meter $0)  Thank you for allowing pharmacy to be a part of this patient's care.  Metformin  - need rx   Dorcas Solian, PharmD Clinical Pharmacist Cell: [phone]

## 2024-06-09 ENCOUNTER — Other Ambulatory Visit: Payer: Self-pay

## 2024-06-09 MED ORDER — METFORMIN HCL 500 MG PO TABS
500.0000 mg | ORAL_TABLET | Freq: Every day | ORAL | 0 refills | Status: DC
Start: 2024-06-09 — End: 2024-06-30
  Filled 2024-06-09: qty 90, 90d supply, fill #0

## 2024-06-10 ENCOUNTER — Ambulatory Visit: Payer: Self-pay | Admitting: Family Medicine

## 2024-06-16 ENCOUNTER — Other Ambulatory Visit: Payer: Self-pay

## 2024-06-18 ENCOUNTER — Other Ambulatory Visit: Payer: Self-pay

## 2024-06-29 ENCOUNTER — Other Ambulatory Visit: Payer: Self-pay

## 2024-06-30 ENCOUNTER — Other Ambulatory Visit: Payer: Self-pay

## 2024-06-30 ENCOUNTER — Encounter: Payer: Self-pay | Admitting: Family Medicine

## 2024-06-30 ENCOUNTER — Telehealth: Payer: Self-pay

## 2024-06-30 ENCOUNTER — Ambulatory Visit: Payer: Self-pay | Admitting: Family Medicine

## 2024-06-30 VITALS — BP 136/84 | HR 80 | Resp 16 | Ht <= 58 in | Wt 185.0 lb

## 2024-06-30 DIAGNOSIS — R079 Chest pain, unspecified: Secondary | ICD-10-CM

## 2024-06-30 DIAGNOSIS — R7989 Other specified abnormal findings of blood chemistry: Secondary | ICD-10-CM

## 2024-06-30 DIAGNOSIS — I10 Essential (primary) hypertension: Secondary | ICD-10-CM

## 2024-06-30 DIAGNOSIS — E119 Type 2 diabetes mellitus without complications: Secondary | ICD-10-CM

## 2024-06-30 DIAGNOSIS — Z5971 Insufficient health insurance coverage: Secondary | ICD-10-CM

## 2024-06-30 MED ORDER — METFORMIN HCL 500 MG PO TABS
500.0000 mg | ORAL_TABLET | Freq: Every day | ORAL | 1 refills | Status: AC
  Filled 2024-06-30: qty 90, 90d supply, fill #0

## 2024-06-30 MED ORDER — METFORMIN HCL 500 MG PO TABS: 500.0000 mg | ORAL_TABLET | Freq: Every day | ORAL | 1 refills | Status: DC

## 2024-06-30 NOTE — Patient Instructions (Signed)
 Informacin de la Visita Gracias por tomarse el tiempo para hablar conmigo hoy. No dude en comunicarse conmigo si puedo ayudarle antes de nuestra prxima cita programada.  Su prxima cita de manejo de atencin ser por telfono el 7 de [place]et[place] de 2025 a las 11:30 a.m.  Seguimiento telefnico en 1 mes.  Por favor llame al equipo de guas de atencin al [phone] si necesita cancelar, programar o reprogramar una cita.  Recursos de Crisis Llame a la [place]nea de [place] de Suicidio y Crisis: [phone] Llame a la Environmental consultant de [place] del Suicidio de [place] [phone] o [phone]) para hablar con un consejero capacitado Llame al 1-800-273-TALK (lnea gratuita, disponible las 24 horas) Visite el [facility] [place] [facility] [place]: [address], [place] [phone]) Llame al 911 si est experimentando ignacia crisis de salud mental o de comportamiento, o si necesita hablar con alguien. Nestora Duos, MSN, RN [facility]  [facility] [place], [place] [facility] Telfono directo: [phone]  Fax: [phone]     Visit Information  Thank you for taking time to visit with me today. Please don't hesitate to contact me if I can be of assistance to you before our next scheduled appointment.  Your next care management appointment is by telephone on 07/27/2024 at 11:30 am  Telephone follow-up in 1 month  Please call the care guide team at [phone] if you need to cancel, schedule, or reschedule an appointment.   Please call the Suicide and Crisis Lifeline: 988 call the [place]  National Suicide Prevention Lifeline: 531-634-0942 or TTY: (416)146-6279 TTY (702)108-2579) to talk to a trained counselor call 1-800-273-TALK (toll free, 24 hour hotline) go to [facility] [address] [place] [phone]) call 911 if you are experiencing a  Mental Health or Behavioral Health Crisis or need someone to talk to.  Nestora Duos, MSN, RN [facility], [facility] RN Care Manager Direct Dial: [phone] Fax: [phone]

## 2024-06-30 NOTE — Patient Outreach (Signed)
 Patient visit ended early due to [facility] responding to emergency with patient of LCSW. Will call patient to reschedule and complete visit.

## 2024-06-30 NOTE — Patient Outreach (Signed)
 Complex Care Management   Visit Note  06/30/2024  Name:  Jessica Bright MRN: [id number] DOB: [birth year]  Situation: Referral received for Complex Care Management related to Diabetes with Complications and Elevated LFT's I obtained verbal consent from Patient.  Visit completed with Patient interpreter  on the phone  Background:   Past Medical History:  Diagnosis Date   GERD (gastroesophageal reflux disease)    Hypertension     Assessment: Patient Reported Symptoms:  Cognitive        Neurological      HEENT        Cardiovascular   Cardiovascular Comment: Patient saw PCP today regarding CP -  given ED precautions, verbalized understanding  Respiratory Respiratory Symptoms Reported: No symptoms reported    Endocrine Endocrine Symptoms Reported: No symptoms reported Is patient diabetic?: Yes Is patient checking blood sugars at home?: Yes List most recent blood sugar readings, include date and time of day: FBG 141 ranging 104-147 no lows    Gastrointestinal Gastrointestinal Symptoms Reported: Other Other Gastrointestinal Symptoms: occasionally feeling bloated in mornings - possibly related to food night before Additional Gastrointestinal Details: discussed healthy diet for Liver and Diabetes at length - included hydration, increasing fruits and vegetables, lean meats, low carb foods, healthy snacks      Genitourinary Genitourinary Symptoms Reported: No symptoms reported    Integumentary Integumentary Symptoms Reported: No symptoms reported    Musculoskeletal Musculoskelatal Symptoms Reviewed: No symptoms reported   Falls in the past year?: No Number of falls in past year: 1 or less Was there an injury with Fall?: No Fall Risk Category Calculator: 0 Patient Fall Risk Level: Low Fall Risk Patient at Risk for Falls Due to: No Fall Risks Fall risk Follow up: Falls evaluation completed  Psychosocial Psychosocial Symptoms Reported: Anxiety - if selected complete  GAD Additional Psychological Details: some anxiety - declines at his time          06/30/2024    PHQ2-9 Depression Screening   Little interest or pleasure in doing things Several days  Feeling down, depressed, or hopeless Several days  PHQ-2 - Total Score 2  Trouble falling or staying asleep, or sleeping too much More than half the days  Feeling tired or having little energy Several days  Poor appetite or overeating  Several days  Feeling bad about yourself - or that you are a failure or have let yourself or your family down Not at all  Trouble concentrating on things, such as reading the newspaper or watching television Not at all  Moving or speaking so slowly that other people could have noticed.  Or the opposite - being so fidgety or restless that you have been moving around a lot more than usual Not at all  Thoughts that you would be better off dead, or hurting yourself in some way Not at all  PHQ2-9 Total Score 6  If you checked off any problems, how difficult have these problems made it for you to do your work, take care of things at home, or get along with other people    Depression Interventions/Treatment      There were no vitals filed for this visit.  Medications Reviewed Today     Reviewed by Devra Lands, RN (Registered Nurse) on 06/30/24 at 1123  Med List Status: <None>   Medication Order Taking? Sig Documenting Provider Last Dose Status Informant  Blood Glucose Monitoring Suppl (BLOOD GLUCOSE MONITOR SYSTEM) w/Device KIT [id number] Yes Use to check blood glucose once daily.  May substitute to TrueMetrix for cost. Tapia, Leisa, PA-C  Active   Glucose Blood (BLOOD GLUCOSE TEST STRIPS) STRP [id number] Yes Use to test blood glucose once daily. May substitute to TrueMetrix for cost. Leavy Michelene RIGGERS  Active   Lancet Device MISC [id number] Yes Use to test blood glucose once daily. May substitute to TrueMetrix for cost. Tapia, Leisa, PA-C  Active   metFORMIN  (GLUCOPHAGE ) 500  MG tablet [id number] Yes Take 1 tablet (500 mg total) by mouth daily with breakfast. Leavy Michelene, PA-C  Active   TRUEplus Lancets 28G MISC [id number] Yes Use to test blood glucose once daily. May substitute to TrueMetrix for cost. Tapia, Leisa, PA-C  Active             Recommendation:   PCP Follow-up Continue Current Plan of Care  Follow Up Plan:   Telephone follow-up in 1 month  Nestora Duos, MSN, RN [facility]  [facility], [facility] RN Care Manager Direct Dial: [phone] Fax: [phone]

## 2024-06-30 NOTE — Progress Notes (Signed)
 Patient ID: Jessica Bright, female    DOB: [birth year], 48 y.o.   MRN: [id number]  PCP: Leavy Mole, PA-C  Chief Complaint  Patient presents with   Chest Pain    Lasted 1-79mins, last occurrence Sunday    Subjective:   Jessica Bright is a 48 y.o. female, presents to clinic with CC of the following: OV done with video/virtual spanish interpreter services  Chest Pain  This is a new problem. The onset quality is undetermined. The problem occurs intermittently. The problem has been resolved. Pain location: central chest. The pain is mild. The quality of the pain is described as squeezing. The pain does not radiate. Pertinent negatives include no abdominal pain, back pain, claudication, cough, diaphoresis, dizziness, exertional chest pressure, fever, headaches, hemoptysis, irregular heartbeat, leg pain, lower extremity edema, malaise/fatigue, nausea, near-syncope, numbness, orthopnea, palpitations, PND, shortness of breath, sputum production, syncope, vomiting or weakness. The pain is aggravated by nothing. She has tried nothing for the symptoms. Risk factors include obesity.    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  Discussed the use of AI scribe software for clinical note transcription with the patient, who gave verbal consent to proceed.  History of Present Illness Jessica Bright is a 48 year old female who presents with intermittent central chest pain.  Chest pain - Intermittent central chest pain described as a 'squeeze'. - Pain does not radiate to the back, shoulder, or stomach. - Episodes last approximately one to two minutes. - Two episodes occurred, with the most recent on Sunday, four days ago; the initial episode occurred on Tuesday or Wednesday of the  previous week. - Episodes occurred while working in [place]; did not require stopping work but slowed down pace. - No associated diaphoresis, clamminess, dyspnea, or nausea.  Respiratory and constitutional symptoms - No cough, fever, sweats, or shortness of breath. - Experienced a self-limited illness about one month ago that resolved without medication.  Diabetes mellitus - Currently taking metformin , one pill once daily.  Gastrointestinal symptoms - No current use of stomach medication/PPI as previously advised.     Patient Active Problem List   Diagnosis Date Noted   New onset type 2 diabetes mellitus (HCC) 03/11/2024   Obesity, unspecified 03/09/2024   Gastroesophageal reflux disease 03/09/2024   Primary hypertension 03/09/2024      Current Outpatient Medications:    Blood Glucose Monitoring Suppl (BLOOD GLUCOSE MONITOR SYSTEM) w/Device KIT, Use to check blood glucose once daily. May substitute to TrueMetrix for cost., Disp: 1 kit, Rfl: 0   Glucose Blood (BLOOD GLUCOSE TEST STRIPS) STRP, Use to test blood glucose once daily. May substitute to TrueMetrix for cost., Disp: 50 strip, Rfl: 1   Lancet Device MISC, Use to test blood glucose once daily. May substitute to TrueMetrix for cost., Disp: 1 each, Rfl: 0   metFORMIN  (GLUCOPHAGE ) 500 MG tablet, Take 1 tablet (500 mg total) by mouth daily with breakfast., Disp: 90 tablet, Rfl: 0   TRUEplus Lancets 28G MISC, Use to test blood glucose once daily. May substitute to TrueMetrix for cost., Disp: 100 each, Rfl: 1   No Known Allergies   Social History   Tobacco Use   Smoking status: Never   Smokeless tobacco: Never  Substance Use Topics   Alcohol use:  Yes   Drug use: Never      Chart Review Today: I personally reviewed active problem list, medication list, allergies, family history, social history, health maintenance, notes from last encounter, lab results, imaging with the patient/caregiver today.   Review  of Systems  Constitutional: Negative.  Negative for diaphoresis, fever and malaise/fatigue.  HENT: Negative.    Eyes: Negative.   Respiratory: Negative.  Negative for cough, hemoptysis, sputum production and shortness of breath.   Cardiovascular:  Positive for chest pain. Negative for palpitations, orthopnea, claudication, syncope, PND and near-syncope.  Gastrointestinal: Negative.  Negative for abdominal pain, nausea and vomiting.  Endocrine: Negative.   Genitourinary: Negative.   Musculoskeletal: Negative.  Negative for back pain.  Skin: Negative.   Allergic/Immunologic: Negative.   Neurological: Negative.  Negative for dizziness, weakness, numbness and headaches.  Hematological: Negative.   Psychiatric/Behavioral: Negative.    All other systems reviewed and are negative.      Objective:   Vitals:   06/30/24 0958  BP: 136/84  Pulse: 80  Resp: 16  SpO2: 97%  Weight: 185 lb (83.9 kg)  Height: 4' 7 (1.397 m)    Body mass index is 43 kg/m.  Physical Exam Vitals and nursing note reviewed.  Constitutional:      General: She is not in acute distress.    Appearance: Normal appearance. She is well-developed and well-groomed. She is obese. She is not ill-appearing, toxic-appearing or diaphoretic.  HENT:     Head: Normocephalic and atraumatic.     Right Ear: External ear normal.     Left Ear: External ear normal.     Nose: Nose normal.  Eyes:     General: No scleral icterus.       Right eye: No discharge.        Left eye: No discharge.     Conjunctiva/sclera: Conjunctivae normal.  Neck:     Trachea: No tracheal deviation.  Cardiovascular:     Rate and Rhythm: Normal rate and regular rhythm.     Pulses: Normal pulses.     Heart sounds: Normal heart sounds. No murmur heard.    No friction rub. No gallop.  Pulmonary:     Effort: Pulmonary effort is normal. No respiratory distress.     Breath sounds: Normal breath sounds. No stridor. No wheezing, rhonchi or rales.   Musculoskeletal:     Right lower leg: No edema.     Left lower leg: No edema.  Skin:    General: Skin is warm and dry.     Coloration: Skin is not jaundiced.     Findings: No bruising or rash.  Neurological:     Mental Status: She is alert.     Motor: No abnormal muscle tone.     Coordination: Coordination normal.     Gait: Gait normal.  Psychiatric:        Mood and Affect: Mood normal.        Behavior: Behavior normal. Behavior is cooperative.      ECG interpretation   Date: 06/30/24  Rate: 70  Rhythm: normal sinus rhythm  QRS Axis: normal  Intervals: normal  ST/T Wave abnormalities: normal  Conduction Disutrbances: none  Narrative Interpretation: NSR, normal ECG  Old EKG Reviewed: yes - disregard the written no prior on print out ECG - reviewed chart last AK[facility] 12/09/2022 no change    Results for orders placed or performed during the hospital encounter of 04/08/24  Hepatitis, Acute   Collection Time: 04/08/24  11:23 AM  Result Value Ref Range   Hepatitis B Surface Ag NON REACTIVE NON REACTIVE   HCV Ab NON REACTIVE NON REACTIVE   Hep A IgM NON REACTIVE NON REACTIVE   Hep B C IgM NON REACTIVE NON REACTIVE  Comprehensive metabolic panel with GFR   Collection Time: 04/08/24 11:23 AM  Result Value Ref Range   Sodium 138 135 - 145 mmol/L   Potassium 3.5 3.5 - 5.1 mmol/L   Chloride 101 98 - 111 mmol/L   CO2 25 22 - 32 mmol/L   Glucose, Bld 124 (H) 70 - 99 mg/dL   BUN 9 6 - 20 mg/dL   Creatinine, Ser 9.53 0.44 - 1.00 mg/dL   Calcium 8.9 8.9 - 89.6 mg/dL   Total Protein 8.4 (H) 6.5 - 8.1 g/dL   Albumin 4.3 3.5 - 5.0 g/dL   AST 808 (H) 15 - 41 U/L   ALT 157 (H) 0 - 44 U/L   Alkaline Phosphatase 94 38 - 126 U/L   Total Bilirubin 1.1 0.0 - 1.2 mg/dL   GFR, Estimated >39 >39 mL/min   Anion gap 12 5 - 15       Assessment & Plan:   Pt is a 48 y/o female presents at the recommendation of nurse call to be checked for intermittent central chest pain with onset with  working and minimal activity, no radiation, no aggravating or alleviating factors, lasting only 1 to 2 minutes without any associated symptoms.  She has not had any chest pain in the past 4 days Patient is a recently established patient at this practice unfortunately she is self-pay without insurance which has limited her ability to do office visits, get medications or pursue recommended workup for other issues of recent concern was new recent diagnosis of diabetes and very elevated LFTs -unfortunately with repeated labs and additional testing her liver enzymes have worsened and she has been unable to obtain imaging due to cost.   Hepatitis screening was negative She was recommended to avoid for toxic agents, NSAIDs, alcohol She reports she is not having any abdominal pain indigestion she has stayed off of the acid reflux medication/PPI, no jaundice itching swelling  Chest pain today does not have any symptoms concerning for ACS EKG was reassuring at this time chest pain etiology is likely to be noncardiac.  She agrees to no further testing or labs at this time including declining a chest x-ray.  Since she is hide her symptoms resolve we did not initiate any treatment but she may want to consider restarting the PPI if symptoms return On exam heart is regular rate and rhythm no murmur gallop or rub lungs are clear No concerning associated cardiac symptoms including no orthopnea, PND, palpitations, near-syncope, diaphoresis, DOE, jaw pain, shortness of breath ER precautions were reviewed at length and patient verbalized understanding's this is also printed for her on the after visit summary in Spanish and annotated highlighted for her    ICD-10-CM   1. Chest pain, unspecified type  R07.9 EKG 12-Lead   see A&P above    2. New onset type 2 diabetes mellitus (HCC)  E11.9 metFORMIN  (GLUCOPHAGE ) 500 MG tablet   she is continuing to take only 500 mg metformin  once daily with no SE or concerns, pt will need  recheck of labs for DM in the next 3 months    3. Elevated LFTs  R79.89    nearing > 5x UNL, neg hepatitis panel, no ETOH, currently no sx,  have recommended f/up with GI/US  [facility]/ ER f/up if new concerning sx - for example jaundice abdominal pain nausea vomiting swelling confusion       4. Primary hypertension  I10 Comprehensive metabolic panel with GFR   BP above goal today, will monitor and initiate med mangement if it remains over goal of <120/70, currently not on meds, consider acei/arb with T2DM    5. Does not have health insurance  Z59.71 Ambulatory referral to Gastroenterology    AMB Referral [facility] Care Management    Comprehensive metabolic panel with GFR    US  ABDOMEN RUQ W/ELASTOGRAPHY   continued barrier to getting necessary care including GI consult, imaging, recommended f/up and labs, refer again to [facility]/charity care, GI   Pt will need to pursue some kind of charity care to expedite f/up and needed further workup on LFTs Lab Results  Component Value Date   ALT 157 (H) 04/08/2024   AST 191 (H) 04/08/2024   ALKPHOS 94 04/08/2024   BILITOT 1.1 04/08/2024         Lab Results  Component Value Date   HGBA1C 6.8 (H) 03/09/2024   Assessment and Plan Assessment & Plan Chest pain Intermittent central chest pain, described as a squeezing sensation, lasting 1-2 minutes. Onset approximately one week ago, with the last episode on Sunday. No associated symptoms such as radiation, sweating, or dyspnea. EKG is normal, and pain is not suspected to be cardiac in origin. Differential diagnosis includes GI/GERD, MSK, pulm, doubt ACS.  No current concern for myocardial infarction. - Provide educational materials in Spanish about chest pain and concerning symptoms that warrant emergency care. - Advise to return if chest pain persists or worsens for further evaluation. - Discuss the option of a chest x-ray, but defer due to cost considerations. - currently has  resolved completely for several days - watchful waiting  Type 2 diabetes mellitus Currently managed with metformin . She has run out of her medication and requires a refill. - Send refill for metformin , one pill once daily. - Schedule follow-up appointment in three months to recheck blood glucose levels.  Recording duration: 14 minutes     Michelene Cower, PA-C 06/30/24 10:38 AM

## 2024-07-01 ENCOUNTER — Other Ambulatory Visit: Payer: Self-pay

## 2024-07-02 ENCOUNTER — Telehealth: Payer: Self-pay

## 2024-07-05 ENCOUNTER — Ambulatory Visit
Admission: RE | Admit: 2024-07-05 | Discharge: 2024-07-05 | Disposition: A | Discharge status: Home / Self Care | Payer: Self-pay | Source: Ambulatory Visit | Attending: Family Medicine | Admitting: Family Medicine

## 2024-07-05 DIAGNOSIS — R7989 Other specified abnormal findings of blood chemistry: Secondary | ICD-10-CM | POA: Insufficient documentation

## 2024-07-05 DIAGNOSIS — Z5971 Insufficient health insurance coverage: Secondary | ICD-10-CM | POA: Insufficient documentation

## 2024-07-06 ENCOUNTER — Ambulatory Visit: Payer: Self-pay

## 2024-07-06 ENCOUNTER — Other Ambulatory Visit: Payer: Self-pay

## 2024-07-06 NOTE — Progress Notes (Deleted)
 S:     No chief complaint on file.   Reason for visit: ?  Jessica Bright is a 49 y.o. female with a history of diabetes (type 2), who presents today for an initial diabetes pharmacotherapy visit.? Pertinent PMH also includes HTN, obesity, GERD.  Known DM Complications: {DM complications:33329}   Care Team: Primary Care Provider: Leavy Mole, PA-C  At last visit, ***.   Patient reports Diabetes was diagnosed in ***.   Current diabetes medications include: metformin  500 mg daily   Patient reports adherence to taking all medications as prescribed.  *** Patient denies adherence with medications, reports missing *** medications *** times per week, on average.  Have you been experiencing any side effects to the medications prescribed? {YES NO:22349} Do you have any problems obtaining medications due to transportation or finances? {YES [id number] Insurance coverage: self-pay  Patient {Actions; denies-reports:120008} hypoglycemic events.  Reported home fasting blood sugars: ***  Reported 2 hour post-meal/random blood sugars: ***.  Patient {Actions; denies-reports:120008} nocturia (nighttime urination).  Patient {Actions; denies-reports:120008} neuropathy (nerve pain). Patient {Actions; denies-reports:120008} visual changes. Patient {Actions; denies-reports:120008} self foot exams.   Patient reported dietary habits: Eats *** meals/day Breakfast: *** Lunch: *** Dinner: *** Snacks: *** Drinks: ***  Patient-reported exercise habits: *** DM Prevention:  Statin: Not taking?  History of chronic kidney disease? no History of albuminuria? no, last UACR on 03/09/24 = 16 mg/g Last eye exam: ***; {lzdmeyeexam:31121}   Tobacco Use:  Tobacco Use: Low Risk  (46/07/2024)   Patient History    Smoking Tobacco Use: Never    Smokeless Tobacco Use: Never    Passive Exposure: Not on file   O:  Vitals:  Wt Readings from Last 3 Encounters:  06/30/24 185 lb (83.9 kg)  04/08/24 185 lb  (83.9 kg)  03/09/24 186 lb (84.4 kg)   BP Readings from Last 3 Encounters:  06/30/24 136/84  04/08/24 128/70  03/09/24 126/70   Pulse Readings from Last 3 Encounters:  06/30/24 80  04/08/24 75  03/09/24 76     Labs:?  Lab Results  Component Value Date   HGBA1C 6.8 (H) 03/09/2024   GLUCOSE 124 (H) 04/08/2024   MICRALBCREAT 16 03/09/2024   CREATININE 0.46 04/08/2024   CREATININE 0.53 03/09/2024   CREATININE 0.38 (L) 12/06/2022    Lab Results  Component Value Date   ALT 157 (H) 04/08/2024   ALT 172 (H) 03/09/2024   AST 191 (H) 04/08/2024   AST 160 (H) 03/09/2024      Chemistry      Component Value Date/Time   NA 138 04/08/2024 1123   NA 138 02/12/2013 0938   K 3.5 04/08/2024 1123   K 3.9 02/12/2013 0938   CL 101 04/08/2024 1123   CL 105 02/12/2013 0938   CO2 25 04/08/2024 1123   CO2 29 02/12/2013 0938   BUN 9 04/08/2024 1123   BUN 8 02/12/2013 0938   CREATININE 0.46 04/08/2024 1123   CREATININE 0.49 (L) 02/12/2013 0938      Component Value Date/Time   CALCIUM 8.9 04/08/2024 1123   CALCIUM 8.0 (L) 02/12/2013 0938   ALKPHOS 94 04/08/2024 1123   ALKPHOS 113 01/18/2012 0222   AST 191 (H) 04/08/2024 1123   AST 26 02/12/2013 0938   ALT 157 (H) 04/08/2024 1123   ALT 67 01/18/2012 0222   BILITOT 1.1 04/08/2024 1123   BILITOT 0.7 01/18/2012 0222       The ASCVD Risk score (Arnett DK, et al., 2019) failed  to calculate for the following reasons:   Cannot find a previous HDL lab   Cannot find a previous total cholesterol lab  Lab Results  Component Value Date   MICRALBCREAT 16 03/09/2024    A/P: Diabetes currently *** with a most recent A1c of *** on ***, which is {DESC; UP/DOWN/UNCHANGED:18711} from *** on ***. Patient is *** able to verbalize appropriate hypoglycemia management plan. Medication adherence appears ***. Control is suboptimal due to ***. -{Meds adjust:18428} basal insulin {basal insulins:33573}  *** units daily.  -{Meds adjust:18428} rapid  insulin {bolus insulin:33574} ***.  -{Meds adjust:18428} GLP-1 {GLP1 options:33572} *** mg .  -{Meds adjust:18428} SGLT2-I {SGLT2i options:33571}*** mg daily.  -{Meds adjust:18428} metformin  ***.  -Patient educated on purpose, proper use, and potential adverse effects of ***.  -Extensively discussed pathophysiology of diabetes, recommended lifestyle interventions, dietary effects on blood sugar control.  -Counseled on s/sx of and management of hypoglycemia.  -Next A1c anticipated ***.   ASCVD risk - {primary/secondary:33575} prevention in patient with diabetes. Last LDL is *** mg/dL, not at goal of <29 *** mg/dL. ASCVD risk factors include *** and 10-year ASCVD risk score of ***. {Desc; low/moderate/high:110033} intensity statin indicated.  -{Meds adjust:18428} {statin therapies:33576} *** mg daily.   Hypertension longstanding *** currently ***. Blood pressure goal of <130/80 *** mmHg. Medication adherence ***. Blood pressure control is suboptimal due to ***. -{Meds adjust:18428} *** mg ***.  {pharmacisttime:33368}  Follow-up:  Pharmacist on *** PCP clinic visit on ***  Peyton CHARLENA Ferries, PharmD Clinical Pharmacist [facility] [phone]

## 2024-07-12 ENCOUNTER — Other Ambulatory Visit: Payer: Self-pay

## 2024-07-13 ENCOUNTER — Ambulatory Visit: Payer: Self-pay | Admitting: Family Medicine

## 2024-07-13 ENCOUNTER — Ambulatory Visit: Payer: Self-pay

## 2024-07-13 NOTE — Progress Notes (Deleted)
 S:     No chief complaint on file.   Reason for visit: ?  Jessica Bright is a 48 y.o. female with a history of diabetes (type 2), who presents today for an initial diabetes pharmacotherapy visit.? Pertinent PMH also includes HTN, obesity, GERD.  Care Team: Primary Care Provider: Leavy Mole, PA-C  At last visit, ***.   Patient reports Diabetes was diagnosed in ***.   Current diabetes medications include: metformin  500 mg daily   Patient reports adherence to taking all medications as prescribed.  *** Patient denies adherence with medications, reports missing *** medications *** times per week, on average.  Have you been experiencing any side effects to the medications prescribed? {YES NO:22349} Do you have any problems obtaining medications due to transportation or finances? {YES [id number] Insurance coverage: self-pay  Patient {Actions; denies-reports:120008} hypoglycemic events.  Reported home fasting blood sugars: ***  Reported 2 hour post-meal/random blood sugars: ***.  Patient {Actions; denies-reports:120008} nocturia (nighttime urination).  Patient {Actions; denies-reports:120008} neuropathy (nerve pain). Patient {Actions; denies-reports:120008} visual changes. Patient {Actions; denies-reports:120008} self foot exams.   Patient reported dietary habits: Eats *** meals/day Breakfast: *** Lunch: *** Dinner: *** Snacks: *** Drinks: ***  Patient-reported exercise habits: *** DM Prevention:  Statin: Not taking?  History of chronic kidney disease? no History of albuminuria? no, last UACR on 03/09/24 = 16 mg/g Last eye exam: ***; {lzdmeyeexam:31121}   Tobacco Use:  Tobacco Use: Low Risk  (06/30/2024)   Patient History    Smoking Tobacco Use: Never    Smokeless Tobacco Use: Never    Passive Exposure: Not on file   O:  Vitals:  Wt Readings from Last 3 Encounters:  06/30/24 185 lb (83.9 kg)  04/08/24 185 lb (83.9 kg)  03/09/24 186 lb (84.4 kg)   BP Readings  from Last 3 Encounters:  06/30/24 136/84  04/08/24 128/70  03/09/24 126/70   Pulse Readings from Last 3 Encounters:  06/30/24 80  04/08/24 75  03/09/24 76     Labs:?  Lab Results  Component Value Date   HGBA1C 6.8 (H) 03/09/2024   GLUCOSE 124 (H) 04/08/2024   MICRALBCREAT 16 03/09/2024   CREATININE 0.46 04/08/2024   CREATININE 0.53 03/09/2024   CREATININE 0.38 (L) 12/06/2022    Lab Results  Component Value Date   ALT 157 (H) 04/08/2024   ALT 172 (H) 03/09/2024   AST 191 (H) 04/08/2024   AST 160 (H) 03/09/2024      Chemistry      Component Value Date/Time   NA 138 04/08/2024 1123   NA 138 02/12/2013 0938   K 3.5 04/08/2024 1123   K 3.9 02/12/2013 0938   CL 101 04/08/2024 1123   CL 105 02/12/2013 0938   CO2 25 04/08/2024 1123   CO2 29 02/12/2013 0938   BUN 9 04/08/2024 1123   BUN 8 02/12/2013 0938   CREATININE 0.46 04/08/2024 1123   CREATININE 0.49 (L) 02/12/2013 0938      Component Value Date/Time   CALCIUM 8.9 04/08/2024 1123   CALCIUM 8.0 (L) 02/12/2013 0938   ALKPHOS 94 04/08/2024 1123   ALKPHOS 113 01/18/2012 0222   AST 191 (H) 04/08/2024 1123   AST 26 02/12/2013 0938   ALT 157 (H) 04/08/2024 1123   ALT 67 01/18/2012 0222   BILITOT 1.1 04/08/2024 1123   BILITOT 0.7 01/18/2012 0222       The ASCVD Risk score (Arnett DK, et al., 2019) failed to calculate for the following reasons:  Cannot find a previous HDL lab   Cannot find a previous total cholesterol lab  Lab Results  Component Value Date   MICRALBCREAT 16 03/09/2024    A/P: Diabetes currently *** with a most recent A1c of *** on ***, which is {DESC; UP/DOWN/UNCHANGED:18711} from *** on ***. Patient is *** able to verbalize appropriate hypoglycemia management plan. Medication adherence appears ***. Control is suboptimal due to ***. -{Meds adjust:18428} basal insulin {basal insulins:33573}  *** units daily.  -{Meds adjust:18428} rapid insulin {bolus insulin:33574} ***.  -{Meds  adjust:18428} GLP-1 {GLP1 options:33572} *** mg .  -{Meds adjust:18428} SGLT2-I {SGLT2i options:33571}*** mg daily.  -{Meds adjust:18428} metformin  ***.  -Patient educated on purpose, proper use, and potential adverse effects of ***.  -Extensively discussed pathophysiology of diabetes, recommended lifestyle interventions, dietary effects on blood sugar control.  -Counseled on s/sx of and management of hypoglycemia.  -Next A1c anticipated ***.   ASCVD risk - {primary/secondary:33575} prevention in patient with diabetes. Last LDL is *** mg/dL, not at goal of <29 *** mg/dL. ASCVD risk factors include *** and 10-year ASCVD risk score of ***. {Desc; low/moderate/high:110033} intensity statin indicated.  -{Meds adjust:18428} {statin therapies:33576} *** mg daily.   Hypertension longstanding *** currently ***. Blood pressure goal of <130/80 *** mmHg. Medication adherence ***. Blood pressure control is suboptimal due to ***. -{Meds adjust:18428} *** mg ***.  {pharmacisttime:33368}  Follow-up:  Pharmacist on *** PCP clinic visit on ***  Peyton CHARLENA Ferries, PharmD Clinical Pharmacist [facility] [phone]

## 2024-07-19 ENCOUNTER — Telehealth: Payer: Self-pay | Admitting: *Deleted

## 2024-07-20 ENCOUNTER — Other Ambulatory Visit: Payer: Self-pay

## 2024-07-20 NOTE — Patient Instructions (Signed)
 Visit Information  Thank you for taking time to visit with me today. Please don't hesitate to contact me if I can be of assistance to you before our next scheduled appointment.  Your next care management appointment is by telephone on 08/02/24 at 10am   Please call the care guide team at [phone] if you need to cancel, schedule, or reschedule an appointment.   Please  if you are experiencing a Mental Health or Behavioral Health Crisis or need someone to talk to.  Thersia Hoar, HEDWIG, MHA [place]  [facility] Social Worker, Population Health [phone]

## 2024-07-20 NOTE — Patient Outreach (Signed)
 Complex Care Management   Visit Note  07/20/2024  Name:  Jessica Bright MRN: [id number] DOB: February 10, 1976  Situation: Referral received for Complex Care Management related to [facility] I obtained verbal consent from Patient.  Visit completed with Patient  on the phone  Background:   Past Medical History:  Diagnosis Date   GERD (gastroesophageal reflux disease)    Hypertension     Assessment: SW completed a telephone outreach using a spanish interpreter, patient states she has not applied for Medicaid, but plans to go to DSS tomorrow to apply. Patient states she will ask for an interpreter. Patient states no other needs at this time.   SDOH Interventions    Flowsheet Row Patient Outreach Telephone from 06/29/2024 in [place] POPULATION HEALTH DEPARTMENT Appointment from 06/01/2024 in [place] POPULATION HEALTH DEPARTMENT Patient Outreach Telephone from 05/18/2024 in [place] POPULATION HEALTH DEPARTMENT  SDOH Interventions     Food Insecurity Interventions Intervention Not Indicated Intervention Not Indicated Community Resources Provided  Housing Interventions Intervention Not Indicated Intervention Not Indicated Intervention Not Indicated  Transportation Interventions Intervention Not Indicated Intervention Not Indicated Intervention Not Indicated  Utilities Interventions Intervention Not Indicated Intervention Not Indicated Intervention Not Indicated  Financial Strain Interventions -- -- Intervention Not Indicated  Physical Activity Interventions -- -- Intervention Not Indicated  Stress Interventions -- -- Intervention Not Indicated      Recommendation:   No recommendations at this time  Follow Up Plan:   Telephone follow-up 08/02/24 at 10am  Thersia Hoar, BSW, MHA [place]  [facility] Social Worker, Population Health [phone]

## 2024-07-27 ENCOUNTER — Other Ambulatory Visit: Payer: Self-pay

## 2024-07-27 NOTE — Patient Outreach (Signed)
 Complex Care Management   Visit Note  07/27/2024  Name:  Jessica Bright MRN: [id number] DOB: 1976/05/23  Situation: Referral received for Complex Care Management related to Diabetes with Complications I obtained verbal consent from Patient.  Visit completed with Patient  on the phone  Background:   Past Medical History:  Diagnosis Date   GERD (gastroesophageal reflux disease)    Hypertension     Assessment: Patient Reported Symptoms:  Cognitive Cognitive Status: No symptoms reported Cognitive/Intellectual Conditions Management [RPT]: None reported or documented in medical history or problem list   Health Maintenance Behaviors: Annual physical exam, Healthy diet Health Facilitated by: Healthy diet  Neurological Neurological Review of Symptoms: Not assessed    HEENT HEENT Symptoms Reported: No symptoms reported HEENT Management Strategies: Routine screening    Cardiovascular Cardiovascular Symptoms Reported: No symptoms reported Does patient have uncontrolled Hypertension?: No Cardiovascular Management Strategies: Routine screening Cardiovascular Comment: no CP, checks BP at pharmacy weekly, cannot recall last reading  Respiratory Respiratory Symptoms Reported: Dry cough Other Respiratory Symptoms: seasonal allergies Respiratory Management Strategies: Routine screening  Endocrine Endocrine Symptoms Reported: No symptoms reported Is patient diabetic?: Yes Is patient checking blood sugars at home?: Yes List most recent blood sugar readings, include date and time of day: FBG today 120 ranging 112-154 no sx high low - checking about QOD    Gastrointestinal Gastrointestinal Symptoms Reported: No symptoms reported Additional Gastrointestinal Details: reports improved - healthy diet Gastrointestinal Management Strategies: Medication therapy    Genitourinary Genitourinary Symptoms Reported: No symptoms reported    Integumentary Integumentary Symptoms Reported: No symptoms  reported    Musculoskeletal Musculoskelatal Symptoms Reviewed: No symptoms reported   Falls in the past year?: No Number of falls in past year: 1 or less Was there an injury with Fall?: No Fall Risk Category Calculator: 0 Patient Fall Risk Level: Low Fall Risk Patient at Risk for Falls Due to: No Fall Risks Fall risk Follow up: Falls evaluation completed  Psychosocial Psychosocial Symptoms Reported: Anxiety - if selected complete GAD Additional Psychological Details: mostly at night Behavioral Management Strategies: Coping strategies Techniques to Cope with Loss/Stress/Change: Diversional activities Do you feel physically threatened by others?: No    07/27/2024    PHQ2-9 Depression Screening   Little interest or pleasure in doing things Several days  Feeling down, depressed, or hopeless Several days  PHQ-2 - Total Score 2  Trouble falling or staying asleep, or sleeping too much    Feeling tired or having little energy    Poor appetite or overeating     Feeling bad about yourself - or that you are a failure or have let yourself or your family down    Trouble concentrating on things, such as reading the newspaper or watching television    Moving or speaking so slowly that other people could have noticed.  Or the opposite - being so fidgety or restless that you have been moving around a lot more than usual    Thoughts that you would be better off dead, or hurting yourself in some way    PHQ2-9 Total Score    If you checked off any problems, how difficult have these problems made it for you to do your work, take care of things at home, or get along with other people    Depression Interventions/Treatment      There were no vitals filed for this visit.  Medications Reviewed Today     Reviewed by Jessica Lands, RN (Registered Nurse) on 07/27/24 at  1145  Med List Status: <None>   Medication Order Taking? Sig Documenting Provider Last Dose Status Informant  Blood Glucose Monitoring  Suppl (BLOOD GLUCOSE MONITOR SYSTEM) w/Device KIT [id number] Yes Use to check blood glucose once daily. May substitute to TrueMetrix for cost. Tapia, Leisa, PA-C  Active   Glucose Blood (BLOOD GLUCOSE TEST STRIPS) STRP [id number] Yes Use to test blood glucose once daily. May substitute to TrueMetrix for cost. Leavy Michelene RIGGERS  Active   Lancet Device MISC [id number] Yes Use to test blood glucose once daily. May substitute to TrueMetrix for cost. Tapia, Leisa, PA-C  Active   metFORMIN  (GLUCOPHAGE ) 500 MG tablet [id number] Yes Take 1 tablet (500 mg total) by mouth daily with breakfast. Leavy Michelene, PA-C  Active   TRUEplus Lancets 28G MISC [id number] Yes Use to test blood glucose once daily. May substitute to TrueMetrix for cost. Tapia, Leisa, PA-C  Active             Recommendation:   PCP Follow-up Continue Current Plan of Care  Follow Up Plan:   Telephone follow-up in 1 month  Nestora Duos, MSN, RN [facility]  [facility], [facility] RN Care Manager Direct Dial: [phone] Fax: [phone]

## 2024-07-27 NOTE — Patient Instructions (Signed)
 Informacin de la Visita  Gracias por tomarse el tiempo para hablar conmigo hoy. Por favor, no dude en comunicarse conmigo si puedo ayudarle antes de nuestra prxima cita programada.  Su prxima cita de manejo de atencin ser por telfono el 5 de [place] de 2025 a las 10:30 a.m.  Seguimiento telefnico en 1 mes.  Por favor, llame al equipo de guas de atencin al [phone] si necesita cancelar, programar o reprogramar una cita.  Por favor, llame a la Lnea de Vida de Suicidio y Crisis: [phone] Llame a la Environmental consultant de Prevencin del Suicidio de EE. UU.: [phone] o [phone]) para hablar con un consejero capacitado Llame al 1-800-273-TALK (lnea gratuita, disponible las 24 horas) Vaya a [facility], [address], [place] [phone]) Llame al 911 si est experimentando ignacia crisis de salud mental o de comportamiento, o si necesita hablar con alguien.  Nestora Duos, MSN, RN [facility] de [facility] [place] [place], Salud Poblacional Osco de [place] everitt [place] Telfono directo: [phone]  Fax: [phone]   Control de la ansiedad en los adultos Managing Anxiety, Adult Despus de haber recibido un diagnstico de ansiedad, es posible que se sienta aliviado al saber por qu se haba sentido o haba actuado de cierto modo. Es posible que tambin se sienta abrumado por el tratamiento que tiene por delante y por lo que este significar para su vida. Con cuidado y apoyo, puede controlar su ansiedad. Cmo manejar los cambios en el estilo de vida Comprender la diferencia entre estrs y ansiedad Aunque el estrs puede desempear un papel en la ansiedad, no es lo mismo que la ansiedad. El estrs es la reaccin del cuerpo ante los cambios y los acontecimientos de la vida, tanto buenos como malos. El estrs a menudo es causado por algo externo, como una fecha lmite, una prueba o una competencia.  Normalmente desaparece despus de que el acontecimiento ha finalizado y dura solo unas horas. Pero el estrs puede ser continuo y conducir a ms que solo estrs. La ansiedad es causada por algo interno, como imaginar un resultado terrible o preocuparse porque algo ir mal y lo Copy. A menudo, la ansiedad no desaparece incluso despus de que el acontecimiento ha finalizado y puede convertirse en una preocupacin a largo plazo (crnica). Reducir el estrs y la ansiedad Hable con el mdico o un consejero para obtener ms informacin sobre cmo reducir la ansiedad y Development worker, community. Es posible que sugiera tcnicas para reducir la tensin, tales como: Msica. Pasar tiempo creando o escuchando msica que disfrute y lo inspire. Meditacin consciente. Practicar el estar consciente de la respiracin normal sin intentar controlarla. Puede realizarse mientras est sentado o camina. Oracin centrante. Centrarse en [employer], frase o imagen sagrada que le signifique algo y le genere [address]. Respiracin profunda. Expanda el estmago e inhale lentamente por la nariz. Mantenga el aire durante unos 3 a 5 segundos. Luego, exhale lentamente mientras deja que los msculos del estmago se relajen. Dilogo interno. Aprenda a observar y Public house manager patrones de pensamiento que provocan reacciones de ansiedad. Cambie esos patrones por pensamientos que transmitan tranquilidad. Relajacin muscular. Dedique tiempo a tensar los msculos y [employer]. Elija una tcnica para reducir la tensin que se adapte a su estilo de vida y su personalidad. Estas tcnicas llevan tiempo y prctica. Resrvese de 5 a 15 minutos por da para Associate Professor. Los terapeutas especializados pueden ofrecer orientacin y capacitacin en estas tcnicas. Es posible que algunos  planes de seguros mdicos cubran la capacitacin. Otras cosas que puede hacer para controlar el estrs y la ansiedad incluyen: Llevar un registro del estrs. Esto puede ayudarlo a  identificar qu le desencadena su reaccin y, luego, aprender las maneras de controlar su respuesta al [place]. Pensar en cmo reacciona ante ciertas situaciones. Es posible que no sea capaz de Chief Operating Officer todo, pero puede Corporate investment banker. Hacerse tiempo para las actividades que lo ayudan a Lexicographer y no sentir culpa por pasar su tiempo de [place]. Crear imgenes visuales. Esto implica imaginar o crear imgenes mentales para ayudarlo a relajarse. Practicar yoga. A travs de las posturas de yoga, puede disminuir la tensin y [place].  Medicamentos Algunos medicamentos para la ansiedad: Antidepresivos. Por lo general, se recetan para el control diario a largo plazo. Medicamentos para la ansiedad. Estos se pueden agregar en casos graves, especialmente cuando ocurren crisis de panama. Cuando se usan juntos, los medicamentos, la psicoterapia y las tcnicas de reduccin de la tensin pueden ser el tratamiento ms efectivo. Las [employer] relaciones interpersonales pueden ser muy importantes para ayudar a su recuperacin. Pase ms tiempo interactuando con amigos y familiares de [place]. Piense en ir a terapia de pareja, si tiene una pareja, tomar clases de educacin familiar o ir a Information systems manager. La terapia puede ayudarlos a usted y a los dems a comprender mejor su ansiedad. Cmo reconocer cambios en su ansiedad Cada persona responde de [place] diferente al tratamiento de la ansiedad. Se dice que se est recuperado de la ansiedad cuando los sntomas se reducen y dejan de interferir en la vida diaria en el hogar o el trabajo. Esto podra significar que comenzar a Radio producer lo siguiente: Tener mejor concentracin y atencin. Tener menos interferencia de la preocupacin en el pensamiento diario. Dormir mejor. Estar menos irritable. Tener ms energa. Tener [employer]. Trate de reconocer cundo su afeccin empeora. Comunquese con el mdico si sus sntomas interfieren en su hogar o su  trabajo, y usted siente que su afeccin no est mejorando. Siga estas instrucciones en su casa: Actividad Actividad fsica. Los adultos deben hacer lo siguiente: Education officer, environmental, al menos, 150 minutos de actividad fsica por semana. El ejercicio debe aumentar la frecuencia cardaca y Media planner transpirar (ejercicio de intensidad moderada). Hacer ejercicios de fortalecimiento por lo [employer] por semana. Duerma bien y por el tiempo adecuado. La [employer] de los adultos necesitan entre 7 y 9 horas de sueo todas las noches. Estilo de vida  Siga una dieta saludable que incluya abundantes frutas, verduras, cereales integrales, productos lcteos descremados y protenas magras. No consuma muchos alimentos con alto contenido de grasas, azcares agregados o sal (sodio). Opte por cosas que le simplifiquen la vida. No consuma ningn producto que contenga nicotina o tabaco. Estos productos incluyen cigarrillos, tabaco para Theatre manager y aparatos de vapeo, como los cigarrillos electrnicos. Si necesita ayuda para dejar de fumar, consulte al mdico. Evite el consumo de cafena, alcohol y ciertos medicamentos contra el resfro de venta sin receta. Estos podran Optician, dispensing. Pregntele al farmacutico qu medicamentos no debera tomar. Instrucciones generales Use los medicamentos de venta libre y los recetados solamente como se lo haya indicado el mdico. Concurra a todas las visitas de seguimiento. Esto es para verificar que est controlando su ansiedad o si necesita ms apoyo. Dnde obtener apoyo Puede conseguir ayuda y chile de: Grupos de peru. Organizaciones comunitarias y en lnea. Un lder espiritual de confianza. Terapia de pareja. Clases de educacin familiar. Terapia familiar. Dnde obtener  ms informacin Formar parte de un grupo de apoyo podra resultarle til para enfrentar la ansiedad. Las siguientes fuentes pueden ayudarlo a Clinical research associate consejeros o grupos de apoyo cerca de su  hogar: Mental Health America (Salud Mental de los Estados Unidos): Tina.net Anxiety and Depression Association of America [ADAA] (Asociacin de Ansiedad y Depresin de los Estados Unidos): adaa.org The First American on Mental Illness (NAMI) (Alianza Nacional Sobre Enfermedades Mentales): nami.org Comunquese con un mdico si: Le resulta difcil permanecer concentrado o finalizar las tareas. Pasa muchas horas por da sintindose preocupado por la vida cotidiana. Est muy cansado porque no puede dejar de preocuparse. Comienza a tener dolores de [place] o se siente tenso con frecuencia. Tiene nuseas o diarrea crnicas. Solicite ayuda de inmediato si: Siente que tiene el corazn acelerado. Le falta el aire. Piensa acerca de lastimarse o lastimar a Economist. Busque ayuda de inmediato si alguna vez siente que puede hacerse dao a usted mismo o a otros, o tiene pensamientos de Patent examiner a su vida. Dirjase al centro de urgencias ms cercano o: Llame al 911. Llame a National Suicide Prevention Lifeline (Lnea Telefnica Nacional para la Prevencin del Suicidio) al [phone] o al 988. Est disponible las 24 horas del da. Enve un mensaje de texto a la lnea para casos de crisis al [phone]. Esta informacin no tiene Theme park manager el consejo del mdico. Asegrese de hacerle al mdico cualquier pregunta que tenga. Document Revised: 08/14/2022 Document Reviewed: 02/15/2021 Elsevier Patient Education  2024 [employer].

## 2024-08-02 ENCOUNTER — Telehealth: Payer: Self-pay

## 2024-08-03 ENCOUNTER — Telehealth: Payer: Self-pay

## 2024-08-03 NOTE — Patient Instructions (Signed)
 Marlicia Baka - I am sorry I was unable to reach you today for our scheduled appointment. I work with Tapia, Leisa, PA-C and am calling to support your healthcare needs. Please contact me at [phone] at your earliest convenience. I look forward to speaking with you soon.   Thank you,  Thersia Hoar, BSW, MHA [place]  [facility] Social Worker, Population Health [phone]

## 2024-08-18 ENCOUNTER — Telehealth: Payer: Self-pay

## 2024-08-18 NOTE — Patient Instructions (Signed)
 Marlicia Baka - I am sorry I was unable to reach you today for our scheduled appointment. I work with Tapia, Leisa, PA-C and am calling to support your healthcare needs. Please contact me at [phone] at your earliest convenience. I look forward to speaking with you soon.   Thank you,  Thersia Hoar, BSW, MHA [place]  [facility] Social Worker, Population Health [phone]

## 2024-08-25 ENCOUNTER — Other Ambulatory Visit: Payer: Self-pay

## 2024-08-25 NOTE — Patient Outreach (Signed)
 Complex Care Management   Visit Note  08/25/2024  Name:  Jessica Bright MRN: [id number] DOB: 02/09/1976  Situation: Referral received for Complex Care Management related to Diabetes with Complications I obtained verbal consent from Patient With Spanish Interpreter.  Visit completed with Patient & Spanish interpreter  on the phone  Background:   Past Medical History:  Diagnosis Date   GERD (gastroesophageal reflux disease)    Hypertension     Assessment: Patient Reported Symptoms:  Cognitive Cognitive Status: No symptoms reported Cognitive/Intellectual Conditions Management [RPT]: None reported or documented in medical history or problem list   Health Maintenance Behaviors: Annual physical exam, Healthy diet Health Facilitated by: Healthy diet  Neurological Neurological Review of Symptoms: No symptoms reported    HEENT HEENT Symptoms Reported: No symptoms reported HEENT Management Strategies: Routine screening    Cardiovascular Cardiovascular Symptoms Reported: No symptoms reported Cardiovascular Management Strategies: Routine screening Cardiovascular Comment: BP - 138/83 most recent  Respiratory Other Respiratory Symptoms: some nasal congestion in am with change in weather but clears by late morinng, unsure if allergies, encouraged flu shot and 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 Respiratory Management Strategies: Routine screening  Endocrine Endocrine Symptoms Reported: No symptoms reported Is patient diabetic?: Yes Is patient checking blood sugars at home?: Yes List most recent blood sugar readings, include date and time of day: No FBG today - states ranging 105-127, posiive feedback for improved FBG, denies lows/highs Endocrine Self-Management Outcome: 4 (good) Endocrine Comment: reports trying to eat as healthy as possible, food stamps stopped but declined need for additional assist at this time, physical job, no addiitonal exercise   Gastrointestinal Gastrointestinal Symptoms Reported: Other Other Gastrointestinal Symptoms: occasional heartburn when eating spicy foods. aware to notify provider if worsens/more frequent Gastrointestinal Management Strategies: Medication therapy    Genitourinary Genitourinary Symptoms Reported: No symptoms reported Genitourinary Management Strategies: Medication therapy  Integumentary Integumentary Symptoms Reported: Not assessed    Musculoskeletal Musculoskelatal Symptoms Reviewed: Back pain Additional Musculoskeletal Details: back pain from physical work at job, discussed stretching prior to work and during, changing position   Falls in the past year?: No Number of falls in past year: 1 or less Was there an injury with Fall?: No Fall Risk Category Calculator: 0 Patient Fall Risk Level: Low Fall Risk    Psychosocial Psychosocial Symptoms Reported: No symptoms reported Additional Psychological Details: aware LCSW available as needed          08/25/2024    PHQ2-9 Depression Screening   Little interest or pleasure in doing things Several days  Feeling down, depressed, or hopeless Several days  PHQ-2 - Total Score 2  Trouble falling or staying asleep, or sleeping too much More than half the days  Feeling tired or having little energy Several days  Poor appetite or overeating  Several days  Feeling bad about yourself - or that you are a failure or have let yourself or your family down Not at all  Trouble concentrating on things, such as reading the newspaper or watching television Not at all  Moving or speaking so slowly that other people could have noticed.  Or the opposite - being so fidgety or restless that you have been moving around a lot more than usual Not at all  Thoughts that you would be better off dead, or hurting yourself in some way Not at all  PHQ2-9 Total Score 6  If you checked off any problems, how difficult have these problems made it  for you to do your work, take  care of things at home, or get along with other people    Depression Interventions/Treatment      There were no vitals filed for this visit.  Medications Reviewed Today     Reviewed by Devra Lands, RN (Registered Nurse) on 08/25/24 at 1039  Med List Status: <None>   Medication Order Taking? Sig Documenting Provider Last Dose Status Informant  Blood Glucose Monitoring Suppl (BLOOD GLUCOSE MONITOR SYSTEM) w/Device KIT [id number] Yes Use to check blood glucose once daily. May substitute to TrueMetrix for cost. Tapia, Leisa, PA-C  Active   Glucose Blood (BLOOD GLUCOSE TEST STRIPS) STRP [id number] Yes Use to test blood glucose once daily. May substitute to TrueMetrix for cost. Leavy Michelene RIGGERS  Active   Lancet Device MISC [id number] Yes Use to test blood glucose once daily. May substitute to TrueMetrix for cost. Tapia, Leisa, PA-C  Active   metFORMIN  (GLUCOPHAGE ) 500 MG tablet [id number] Yes Take 1 tablet (500 mg total) by mouth daily with breakfast. Tapia, Leisa, PA-C  Active   pantoprazole  (PROTONIX ) 40 MG tablet [id number] Yes Take 40 mg by mouth daily. [provider]  Active   TRUEplus Lancets 28G MISC [id number] Yes Use to test blood glucose once daily. May substitute to TrueMetrix for cost. Tapia, Leisa, PA-C  Active             Recommendation:   PCP Follow-up Continue Current Plan of Care BSW follow up 08/31/24 at 12:00 regarding financial assist - uninsured.   Follow Up Plan:   Telephone follow-up in 1 month  Lands Devra, MSN, RN [facility] Health  [facility], [facility] Health RN Care Manager Direct Dial: [phone] Fax: [phone]

## 2024-08-25 NOTE — Patient Instructions (Signed)
 Informacin de la Visita Gracias por tomarse el tiempo para hablar conmigo hoy. Por favor, no dude en contactarme si puedo ayudarle antes de nuestra prxima cita programada. Su prxima cita de manejo de atencin ser por telfono el 3 de diciembre de 2025 a las 10:00 a.m. Seguimiento telefnico en 1 mes. Por favor, llame al equipo de guas de atencin al [phone] si necesita cancelar, programar o reprogramar una cita. Por favor, llame a la Lnea de Vida de Suicidio y Crisis: [phone] Llame a la [employer] de Prevencin del Suicidio de [place] [phone] o [phone]) para hablar con un consejero capacitado Llame al 1-800-273-TALK (lnea gratuita, disponible las 24 horas) Vaya al [employer] de [facility] de Salud Mental del [place]: [address]  [phone]) Llame al 911 si est experimentando ignacia crisis de salud mental o de comportamiento, o si necesita hablar con alguien.   Nestora Duos, MSN, RN [facility], [facility] Health RN Care Manager Direct Dial: [phone] Fax: [phone]  Ejercicios para la espalda Back Exercises Estos ejercicios ayudan a fortalecer el tronco y la espalda. Adems, ayudan a mantener la flexibilidad de la zona lumbar. Hacer estos ejercicios puede ser de ayuda para evitar o advertising copywriter. Si tiene dolor de espalda, trate de hacer estos ejercicios 2 o 3 veces por da, o como se lo haya indicado el mdico. A medida que [place], haga los ejercicios una vez por da. Repita los ejercicios con ms frecuencia como se lo haya indicado el mdico. Para evitar que el dolor regrese, haga los ejercicios una vez por da o como se lo haya indicado el mdico. Haga los ejercicios exactamente como se lo haya indicado el mdico. Detngase de inmediato si siente un dolor repentino o el dolor empeora. Ejercicios Rodilla al pecho Repita estos pasos 3 a 5 veces seguidas con cada  pierna: Acustese boca arriba sobre una cama dura o sobre el suelo con las piernas extendidas. Lleve una rodilla al pecho. Agarre la rodilla o el muslo con ambas manos y sostngalo. Tire de la rodilla hasta sentir una elongacin suave en la parte baja de la espalda o las nalgas. Mantenga la elongacin durante 10 a 30 segundos. Suelte y extienda la pierna lentamente. Inclinacin de la pelvis Repita estos pasos 5 a 10 veces seguidas: Acustese boca arriba sobre una cama dura o sobre el suelo con las piernas extendidas. Flexione las rodillas de manera que apunten al techo. Los pies deben estar apoyados en el suelo. Contraiga los msculos de la parte baja del vientre (abdomen) para empujar la zona lumbar contra el suelo. Este movimiento har que el coxis apunte hacia el techo, en lugar de apuntar hacia abajo en direccin a los pies o al suelo. Mantenga esta posicin durante 5 a 10 segundos mientras contrae suavemente los msculos y respira con normalidad. El perro y el gato Repita estos pasos hasta que la zona lumbar se curve con ms facilidad: Colquese con washing[employer] mutual y las rodillas sobre una cama dura o sobre el suelo. Las manos deben estar alineadas con los hombros y las rodillas con las caderas. Puede colocarse almohadillas debajo de las rodillas. Deje que la cabeza cuelgue hacia el pecho. Tense (contraiga) los msculos del vientre. Baje el coxis en direccin al suelo de modo que la zona lumbar se arquee como el lomo de un gato asustado. Mantenga esta posicin durante 5 segundos. Levante lentamente la cabeza. Relaje los msculos del vientre. Eleve el coxis  de modo que apunte en direccin al techo para que la espalda forme un arco hundido como el lomo de un perro contento. Mantenga esta posicin durante 5 segundos.  Flexiones de Public House Manager pasos 5 a 10 veces seguidas: Acustese sobre la panza (boca abajo) sobre una cama dura o sobre el suelo. Ponga las manos cerca de la cabeza,  separadas aproximadamente al ancho de los hombros. Con la espalda relajada y las caderas apoyadas en el suelo, extienda lentamente los brazos para levantar la mitad superior del cuerpo y optometrist los hombros. No use los msculos de la espalda. Para estar ms cmodo, puede cambiar la international paper. Mantenga esta posicin durante 5 segundos. Mantenga la espalda relajada. Lentamente vuelva a la posicin horizontal.  Puentes Repita estos pasos 10 veces seguidas: Acustese boca arriba sobre una cama firme o sobre el suelo. Flexione las rodillas de manera que apunten al techo. Los pies deben estar apoyados en el suelo. Los brazos deben estar paralelos a los costados del cuerpo, junto al cuerpo. Contraiga los glteos y despegue las nalgas del suelo hasta que la cintura est casi a la altura de las rodillas. Si no siente el trabajo muscular en las nalgas y la parte posterior de los muslos, aleje los pies 1 a 2 pulgadas (2,5 a 5 centmetros) de las nalgas. Mantenga esta posicin durante 3 a 5 segundos. Lentamente, vuelva a apoyar las nalgas en el suelo y relaje los glteos. Si este ejercicio le resulta muy fcil, intente realizarlo con los brazos cruzados [place]. Abdominales Repita estos pasos 5 a 10 veces seguidas: Acustese boca arriba sobre una cama dura o sobre el suelo con las piernas extendidas. Flexione las rodillas de manera que apunten al techo. Los pies deben estar apoyados en el suelo. Cruce los world fuel services corporation. Baje levemente el mentn en direccin al pecho, pero no doble el cuello. Contraiga los msculos del abdomen y con lentitud eleve el pecho lo suficiente como para despegar levemente los omplatos del suelo. Evite levantar el cuerpo ms alto que eso, porque puede sobreexigir la zona lumbar. Lentamente baje el pecho y la cabeza hasta el suelo. Elevaciones de espalda Repita estos pasos 5 a 10 veces seguidas: Acustese sobre la panza (boca abajo) con los brazos a los  costados y apoye la frente en el suelo. Contraiga los msculos de las piernas y los glteos. Lentamente despegue el pecho del suelo mientras mantiene las caderas apoyadas en el suelo. Mantenga la nuca alineada con la curvatura de la espalda. Mire hacia el suelo mientras hace este ejercicio. Mantenga esta posicin durante 3 a 5 segundos. Lentamente baje el pecho y el rostro hasta el suelo. Comunquese con un mdico si: El dolor de espalda se vuelve mucho ms intenso cuando hace un ejercicio. El dolor de espalda no mejora 2 horas despus de [employer] ejercicios. Si tiene alguno de limited brands, deje de aram[employer] corporation ejercicios. No vuelva a hacer los ejercicios a menos que el mdico lo autorice. Solicite ayuda de inmediato si: Siente un dolor sbito y muy intenso en la espalda. Si esto ocurre, deje de toys 'r' us. No vuelva a hacer los ejercicios a menos que el mdico lo autorice. Esta informacin no tiene theme park manager el consejo del mdico. Asegrese de hacerle al mdico cualquier pregunta que tenga. Document Revised: 01/09/2021 Document Reviewed: 01/09/2021 Elsevier Patient Education  2024 [employer].

## 2024-08-31 ENCOUNTER — Other Ambulatory Visit: Payer: Self-pay

## 2024-08-31 NOTE — Patient Outreach (Signed)
 Social Drivers of Health  Community Resource and Care Coordination Visit Note   08/31/2024  Name: Jessica Bright MRN: [id number] DOB:07/15/1976  Situation: Referral received for [facility] needs assessment and assistance related to health insurance. I obtained verbal consent from Patient.  Visit completed with Patient on the phone with interpreter.   Background:      Assessment:   Goals Addressed             This Visit's Progress    COMPLETED: BSW VBCI Social Work Care Plan       Problems:   Health Insurance  CSW Clinical Goal(s):   Over the next 30 days the Patient will work with DSS to address needs related to Medicaid.  Interventions:  SW provided patient with information needed to apply for Medicaid  Patient Goals/Self-Care Activities:  Patient will go to [facility] DSS to apply for [facility] tomorrow. Patient plans to go apply for Medicaid this week.   Plan:   The patient has been provided with contact information for the care management team and has been advised to call with any health related questions or concerns.         Recommendation:   Patient will apply for insurance.  Follow Up Plan:   Patient has achieved all patient stated goals. [employer] will be closed. Patient has been provided contact information should new needs arise.   Jessica Bright, Jessica Bright, MHA [place]  [facility] Social Worker, Population Health [phone]

## 2024-08-31 NOTE — Patient Instructions (Signed)
 Visit Information  Thank you for taking time to visit with me today. Please don't hesitate to contact me if I can be of assistance to you before our next scheduled appointment.  Your next care management appointment is no further scheduled appointments.      Please call the care guide team at [phone] if you need to cancel, schedule, or reschedule an appointment.   Please call the Suicide and Crisis Lifeline: 988 call the [place]  National Suicide Prevention Lifeline: 949-692-7248 or TTY: (207) 720-4383 TTY [phone]) to talk to a trained counselor call 1-800-273-TALK (toll free, 24 hour hotline) go to [facility] [address], [place] [phone]) call 911 if you are experiencing a Mental Health or Behavioral Health Crisis or need someone to talk to.  Thersia Hoar, HEDWIG, MHA [place]  [facility] Social Worker, Population Health [phone]

## 2024-09-22 ENCOUNTER — Other Ambulatory Visit: Payer: Self-pay

## 2024-09-22 NOTE — Patient Outreach (Signed)
 Complex Care Management   Visit Note  09/22/2024  Name:  Jessica Bright MRN: [id number] DOB: 1976/06/30  Situation: Referral received for Complex Care Management related to Diabetes with Complications I obtained verbal consent from Patient.  Visit completed with Patient And Interpreter  on the phone  Background:   Past Medical History:  Diagnosis Date   GERD (gastroesophageal reflux disease)    Hypertension     Assessment: Patient Reported Symptoms:  Cognitive Cognitive Status: No symptoms reported Cognitive/Intellectual Conditions Management [RPT]: None reported or documented in medical history or problem list   Health Maintenance Behaviors: Annual physical exam  Neurological Neurological Review of Symptoms: Headaches Neurological Comment: occasional headaches  HEENT HEENT Symptoms Reported: No symptoms reported      Cardiovascular Cardiovascular Symptoms Reported: No symptoms reported Does patient have uncontrolled Hypertension?: No Cardiovascular Management Strategies: Routine screening  Respiratory Respiratory Symptoms Reported: No symptoms reported Respiratory Management Strategies: Routine screening  Endocrine Endocrine Symptoms Reported: No symptoms reported Is patient diabetic?: Yes Is patient checking blood sugars at home?: Yes List most recent blood sugar readings, include date and time of day: FBG today 143 high due to late meal out, ate carbs, usual range 101-121, no issue high/low aware to notify provider if FBG continues to be elevated    Gastrointestinal Gastrointestinal Symptoms Reported: Nausea Other Gastrointestinal Symptoms: rare nausea Gastrointestinal Management Strategies: Medication therapy Gastrointestinal Comment: using pantoprozloel prn about 2-3 x week    Genitourinary Genitourinary Symptoms Reported: No symptoms reported    Integumentary Integumentary Symptoms Reported: No symptoms reported    Musculoskeletal Musculoskelatal Symptoms Reviewed:  Back pain   Falls in the past year?: No Number of falls in past year: 1 or less Was there an injury with Fall?: No Fall Risk Category Calculator: 0 Patient Fall Risk Level: Low Fall Risk Patient at Risk for Falls Due to: No Fall Risks Fall risk Follow up: Falls evaluation completed, Falls prevention discussed  Psychosocial Psychosocial Symptoms Reported: Sadness - if selected complete PHQ 2-9 Additional Psychological Details: Sadness, concern with everything going on, coworker picking her up, not going out on own, adult daughters an provide help if needed, has plan if something happens to her regarding care of minor children          09/22/2024    PHQ2-9 Depression Screening   Little interest or pleasure in doing things Several days  Feeling down, depressed, or hopeless Several days  PHQ-2 - Total Score 2  Trouble falling or staying asleep, or sleeping too much    Feeling tired or having little energy    Poor appetite or overeating     Feeling bad about yourself - or that you are a failure or have let yourself or your family down    Trouble concentrating on things, such as reading the newspaper or watching television    Moving or speaking so slowly that other people could have noticed.  Or the opposite - being so fidgety or restless that you have been moving around a lot more than usual    Thoughts that you would be better off dead, or hurting yourself in some way    PHQ2-9 Total Score    If you checked off any problems, how difficult have these problems made it for you to do your work, take care of things at home, or get along with other people    Depression Interventions/Treatment      Today's Vitals   09/22/24 1011  BP: 135/80  Medications Reviewed Today   Medications were not reviewed in this encounter     Recommendation:   PCP Follow-up Continue Current Plan of Care  Follow Up Plan:   Telephone follow-up in 1 month  Nestora Duos, MSN, RN [facility] Health   [facility], [facility] Health RN Care Manager Direct Dial: [phone] Fax: [phone]

## 2024-09-22 NOTE — Patient Instructions (Signed)
 Informacin de la Visita Gracias por tomarse el tiempo para reunirse conmigo hoy. Por favor, no dude en comunicarse conmigo si puedo ayudarle antes de nuestra prxima cita programada. Su prxima cita de manejo de cuidados ser por telfono el 31/09/2024 a las 10:30 a. m. Seguimiento telefnico en 1 mes. Por favor, llame al equipo de guas de atencin al [phone] si necesita cancelar, programar o reprogramar una cita.  Llame a la Lnea de Vida para Suicidio y Crisis: 988 Llame a la [employer] para la Prevencin del Suicidio en [place].: [phone] o TTY: [phone] TTY [phone]) para hablar con un consejero capacitado Llame al 1-800-273-TALK (lnea gratuita, disponible las 24 horas) Vaya a [facility] Urgent Care: [address] [place] [phone]) Llame al 911 si est experimentando ignacia crisis de salud mental o de comportamiento, o si necesita hablar con alguien.  Nestora Duos, MSN, RN [facility], [facility] Health RN Care Manager Telfono directo: [phone]  Fax: [phone]      Visit Information  Thank you for taking time to visit with me today. Please don't hesitate to contact me if I can be of assistance to you before our next scheduled appointment.  Your next care management appointment is by telephone on 10/20/2024 at 10:30 am  Telephone follow-up in 1 month  Please call the care guide team at [phone] if you need to cancel, schedule, or reschedule an appointment.   Please call the Suicide and Crisis Lifeline: 988 call the [place]  National Suicide Prevention Lifeline: (909)573-4933 or TTY: 734-052-2051 TTY 2602062688) to talk to a trained counselor call 1-800-273-TALK (toll free, 24 hour hotline) go to [facility] [address], [place] [phone]) call 911 if you are experiencing a Mental Health or Behavioral Health Crisis or need someone  to talk to.  Nestora Duos, MSN, RN [facility], [facility] Health RN Care Manager Direct Dial: [phone] Fax: [phone]

## 2024-09-30 ENCOUNTER — Encounter: Payer: Self-pay | Admitting: Family Medicine

## 2024-09-30 ENCOUNTER — Ambulatory Visit: Payer: Self-pay | Admitting: Family Medicine

## 2024-09-30 ENCOUNTER — Ambulatory Visit: Payer: Self-pay

## 2024-09-30 VITALS — BP 148/88 | HR 122 | Temp 101.2°F | Ht <= 58 in | Wt 182.6 lb

## 2024-09-30 DIAGNOSIS — J069 Acute upper respiratory infection, unspecified: Secondary | ICD-10-CM | POA: Insufficient documentation

## 2024-09-30 LAB — INFLUENZA A AND B AG, IMMUNOASSAY
INFLUENZA A ANTIGEN: NOT DETECTED
INFLUENZA B ANTIGEN: NOT DETECTED

## 2024-09-30 MED ORDER — AZELASTINE HCL 0.1 % NA SOLN: 2.0000 | Freq: Two times a day (BID) | NASAL | 1 refills | Status: AC

## 2024-09-30 NOTE — Telephone Encounter (Signed)
 FYI Only or Action Required?: FYI only for provider: appointment scheduled on This .  Patient was last seen in primary care on 06/30/2024 by Leavy Mole, PA-C.  Called Nurse Triage reporting Fatigue and Generalized Body Aches.  Symptoms began several days ago.  Interventions attempted: OTC medications: IBU.  Symptoms are: gradually worsening.  Triage Disposition: See Provider today Patient/caregiver understands and will follow disposition?: yes                              Copied from CRM #[id number]. Topic: Clinical - Red Word Triage >> Sep 30, 2024  9:40 AM Rosina BIRCH wrote: Red Word that prompted transfer to Nurse Triage: patient daughter called stating the patient fever is a lot higher than yesterday and she is having chills Reason for Disposition  [1] MODERATE weakness (e.g., interferes with work, school, normal activities) AND [2] cause unknown  (Exceptions: Weakness from acute minor illness or poor fluid intake; weakness is chronic and not worse.)  Answer Assessment - Initial Assessment Questions Call from daughter, not with pt. She did not have many details. Called pt directly with interpreter.  Pt stated she has a fever of 110-112. Pt states she is weak and has BA. She has not eaten much in the past few days, and feels she has flu s/s.      1. DESCRIPTION: Describe how you are feeling.     Cannot walk - body aches. Had flu s/s  - cough 2. SEVERITY: How bad is it?  Can you stand and walk?     Weakness - has not eaten in 3 days 3. ONSET: When did these symptoms begin? (e.g., hours, days, weeks, months)     Last night IBU 4. CAUSE: What do you think is causing the weakness or fatigue? (e.g., not drinking enough fluids, medical problem, trouble sleeping)     Flu?  6. OTHER SYMPTOMS: Do you have any other symptoms? (e.g., chest pain, fever, cough, SOB, vomiting, diarrhea, bleeding, other areas of pain)     Fever - was told that fever is  110-112, BA, Last night some diarrhea  Protocols used: Weakness (Generalized) and Fatigue-A-AH

## 2024-09-30 NOTE — Patient Outreach (Signed)
 RNCM received multiple calls from unknown number and no message - called number after viit - patient' daughter calling, patient ill and daughter did not know who to call for appointment. Given name of practice and provider and phone number. Determined no sx requiring ED now per daughter nd advised to contact office for cute appointment or go to UC. Daughter will call for appointment as discussed and agreed to UC if no appointment available.

## 2024-09-30 NOTE — Patient Instructions (Signed)
 Your symptoms and exam findings are most consistent with a viral upper respiratory infection. These usually run their course in 5-7 days. Unfortunately, antibiotics don't work against viruses and just increase your risk of other issues such as diarrhea, yeast infections, and resistant infections.  If your symptoms last longer than 10 days and/or you start feeling worse with facial pain, high fever, cough, shortness of breath or start feeling significantly worse, please call us right away to be further evaluated.  Some things that can make you feel better are: - Increased rest - Increasing fluid with water/sugar free electrolytes - Acetaminophen as needed for fever/pain.  - Salt water gargling, chloraseptic spray and throat lozenges - OTC guaifenesin (Mucinex).  - Saline sinus flushes or a neti pot.  - Humidifying the air.

## 2024-09-30 NOTE — Progress Notes (Signed)
 Acute Office Visit  Patient ID: Jessica Bright, female    DOB: 10-May-1976, 48 y.o.   MRN: [id number]  PCP: Leavy Mole, PA-C (Inactive)  Chief Complaint  Patient presents with   Acute Visit    Fever since yesterday. Headache. Cough with mucous but cleared up.     Subjective:     HPI  Discussed the use of AI scribe software for clinical note transcription with the patient, who gave verbal consent to proceed.  History of Present Illness Jessica Bright is a 48 year old female who presents with fever and headache.  She has been experiencing fever since yesterday, accompanied by a headache. No current cough, but she notes it resolved about three days ago. Her throat feels very dry, but there is no sore throat. Generalized body aches are present when asked about ear pain.  She has not taken a COVID test yet. No nasal congestion or runny nose, but she recalls white nasal discharge. No shortness of breath or wheezing.  She has been taking ibuprofen and reports she cannot take Tylenol  due to liver issues.   Review of Systems  All other systems reviewed and are negative.   Past Medical History:  Diagnosis Date   GERD (gastroesophageal reflux disease)    Hypertension     Past Surgical History:  Procedure Laterality Date   CHOLECYSTECTOMY     Around 2022    Medications Ordered Prior to Encounter[1]  Allergies[2]     Objective:    BP (!) 148/88   Pulse (!) 122   Temp (!) 101.2 F (38.4 C)   Ht 4' 7 (1.397 m)   Wt 182 lb 9.6 oz (82.8 kg)   SpO2 96%   BMI 42.44 kg/m    Physical Exam Vitals and nursing note reviewed.  Constitutional:      Appearance: Normal appearance. She is normal weight.  HENT:     Head: Normocephalic and atraumatic.     Right Ear: Tympanic membrane, ear canal and external ear normal.     Left Ear: Tympanic membrane, ear canal and external ear normal.     Nose: Nose normal.     Right Sinus: No maxillary sinus tenderness or frontal sinus  tenderness.     Left Sinus: No maxillary sinus tenderness or frontal sinus tenderness.     Mouth/Throat:     Mouth: Mucous membranes are moist.     Pharynx: Oropharynx is clear.  Eyes:     Extraocular Movements: Extraocular movements intact.     Conjunctiva/sclera: Conjunctivae normal.  Cardiovascular:     Rate and Rhythm: Normal rate and regular rhythm.     Pulses: Normal pulses.     Heart sounds: Normal heart sounds.  Pulmonary:     Effort: Pulmonary effort is normal.     Breath sounds: Normal breath sounds.  Musculoskeletal:     Cervical back: No tenderness.  Lymphadenopathy:     Cervical: No cervical adenopathy.  Skin:    General: Skin is warm.  Neurological:     General: No focal deficit present.     Mental Status: She is alert and oriented to person, place, and time. Mental status is at baseline.  Psychiatric:        Mood and Affect: Mood normal.        Behavior: Behavior normal.        Thought Content: Thought content normal.        Judgment: Judgment normal.  Results for orders placed or performed in visit on 09/30/24  Influenza A and B Ag, Immunoassay  Result Value Ref Range   Source: NASAL    INFLUENZA A ANTIGEN NOT DETECTED NOT DETECTED   INFLUENZA B ANTIGEN NOT DETECTED NOT DETECTED       Assessment & Plan:   Problem List Items Addressed This Visit       Respiratory   Viral URI - Primary    Assessment and Plan Assessment & Plan Acute upper respiratory infection Likely viral etiology. Negative flu test. No bacterial infection signs. Expected resolution in 3-5 days. - Continue ibuprofen every 4-6 hours for fever. - Use nasal sprays for runny nose relief. - Maintain hydration and consume warm broths. - Rest and monitor symptoms. - Perform home COVID test; consider antivirals if positive. - Seek immediate care if severe cough or difficulty breathing. - Avoid work until fever-free and symptoms improve for 24 hours.    Meds ordered this  encounter  Medications   azelastine (ASTELIN) 0.1 % nasal spray    Sig: Place 2 sprays into both nostrils 2 (two) times daily. Use in each nostril as directed    Dispense:  301 mL    Refill:  1    Use generic Astelin    Supervising Provider:   DUANNE LOWERS T [[id number]]    No follow-ups on file.  Jeoffrey GORMAN Barrio, FNP [facility]      [1]  Current Outpatient Medications on File Prior to Visit  Medication Sig Dispense Refill   Blood Glucose Monitoring Suppl (BLOOD GLUCOSE MONITOR SYSTEM) w/Device KIT Use to check blood glucose once daily. May substitute to True[employer] for cost. 1 kit 0   Glucose Blood (BLOOD GLUCOSE TEST STRIPS) STRP Use to test blood glucose once daily. May substitute to TrueMetrix for cost. 50 strip 1   Lancet Device MISC Use to test blood glucose once daily. May substitute to TrueMetrix for cost. 1 each 0   metFORMIN  (GLUCOPHAGE ) 500 MG tablet Take 1 tablet (500 mg total) by mouth daily with breakfast. 90 tablet 1   pantoprazole  (PROTONIX ) 40 MG tablet Take 40 mg by mouth daily.     TRUEplus Lancets 28G MISC Use to test blood glucose once daily. May substitute to TrueMetrix for cost. 100 each 1   No current facility-administered medications on file prior to visit.  [2] No Known Allergies

## 2024-09-30 NOTE — Progress Notes (Unsigned)
° °  Subjective:    Patient ID: Jessica Bright, female    DOB: 05-27-1976, 48 y.o.   MRN: [id number]  HPI    Review of Systems     Objective:   Physical Exam        Assessment & Plan:   This encounter was created in error - please disregard.

## 2024-10-01 ENCOUNTER — Telehealth: Payer: Self-pay

## 2024-10-20 ENCOUNTER — Telehealth: Payer: Self-pay
